# Patient Record
Sex: Female | Born: 1980 | Race: White | Hispanic: Yes | Marital: Married | Smoking: Never smoker
Health system: Southern US, Community
[De-identification: ages and names within clinical notes are randomized; demographics above are authoritative.]

## PROBLEM LIST (undated history)

## (undated) HISTORY — PX: BREAST BIOPSY: SHX20

---

## 2004-04-24 ENCOUNTER — Inpatient Hospital Stay: Payer: Self-pay | Admitting: Obstetrics and Gynecology

## 2006-09-12 ENCOUNTER — Ambulatory Visit: Payer: Self-pay

## 2006-09-12 IMAGING — US ULTRASOUND RIGHT BREAST
1 series · 16 of 16 positions shown · non-contrast
Comparison: RIGHT mammogram on [DATE].

REASON FOR EXAM: chest wall mass 1-3 oclock
COMMENTS:

PROCEDURE:     US  - US BREAST RIGHT  - [DATE]  [DATE]
RESULT:

[Series 1: ultrasound right breast · 16 of 16 slices shown]
[im 1/16]
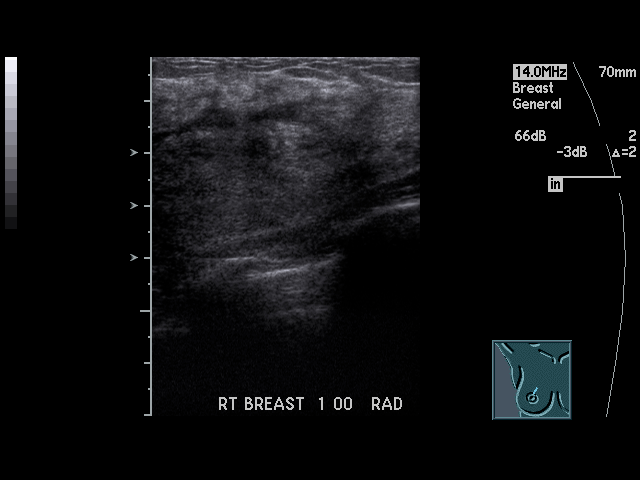
[im 2/16]
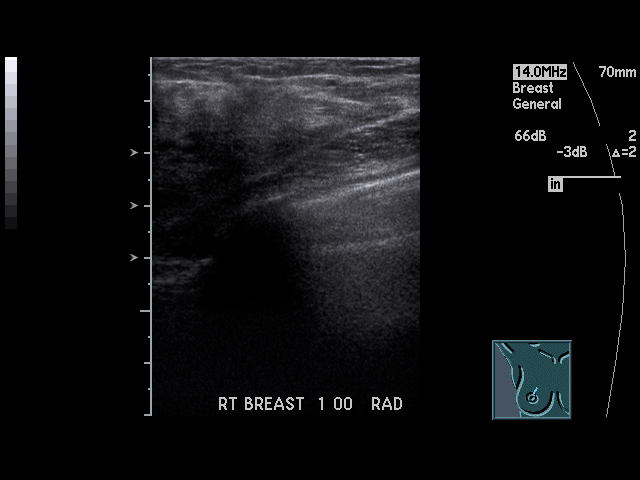
[im 3/16]
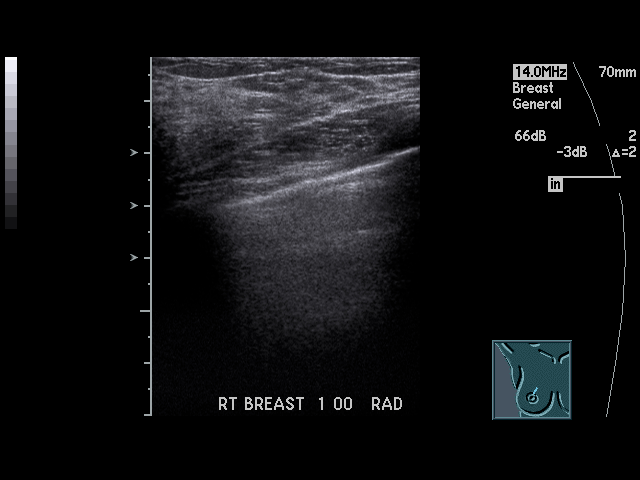
[im 4/16]
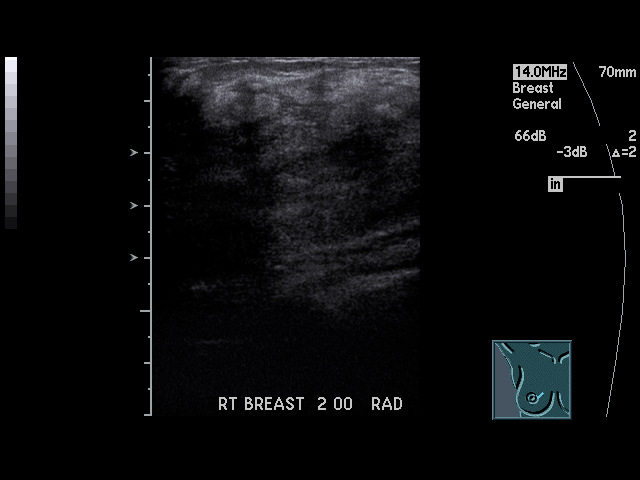
[im 5/16]
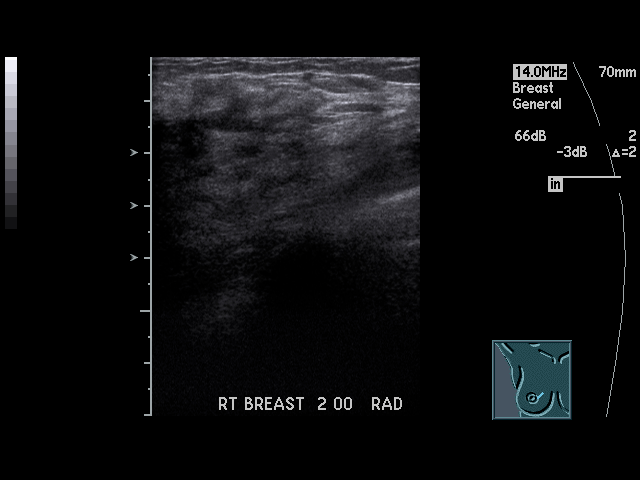
[im 6/16]
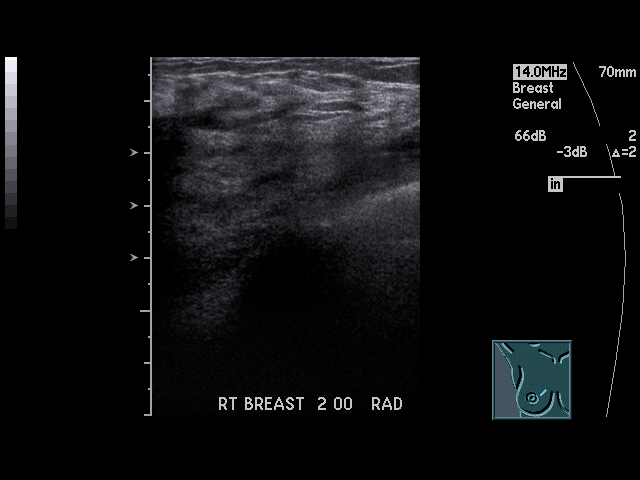
[im 7/16]
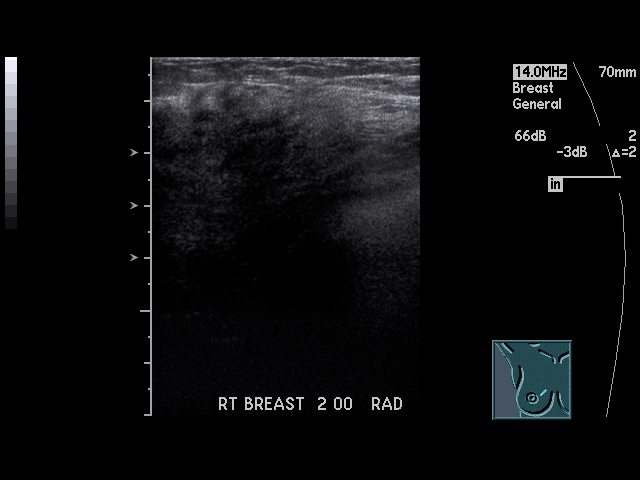
[im 8/16]
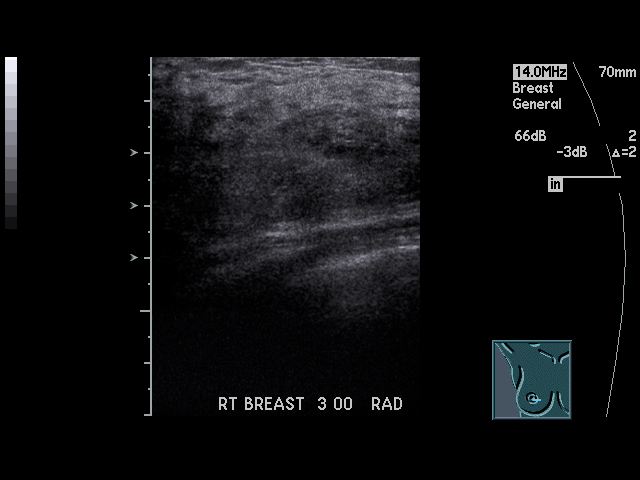
[im 9/16]
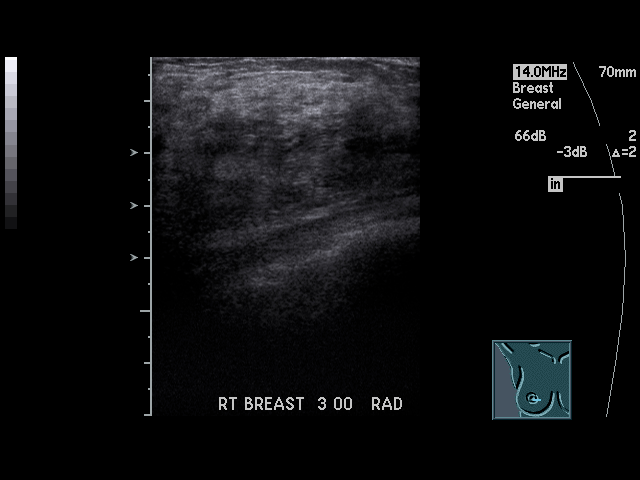
[im 10/16]
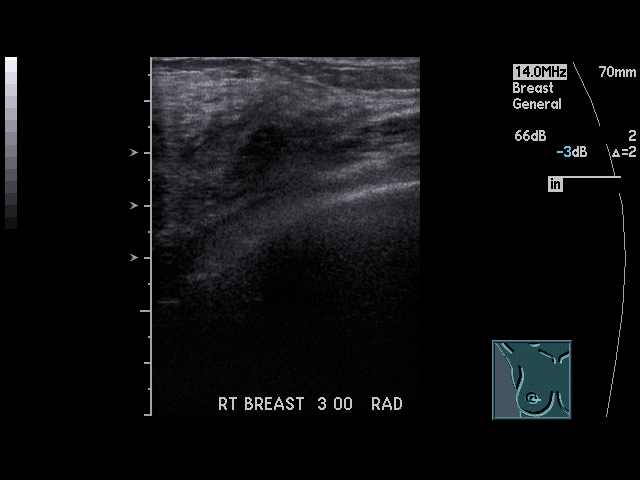
[im 11/16]
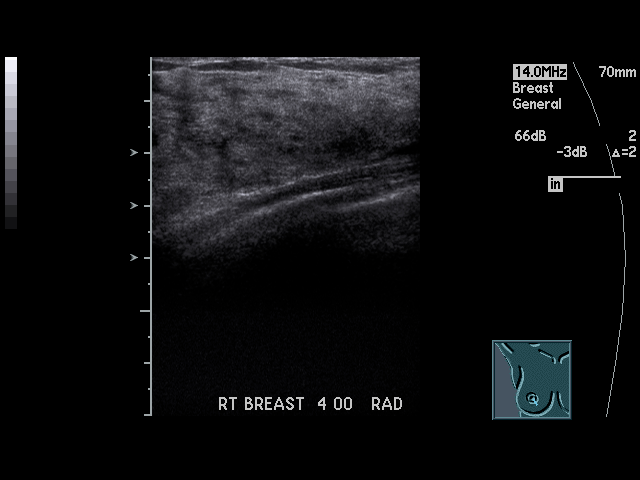
[im 12/16]
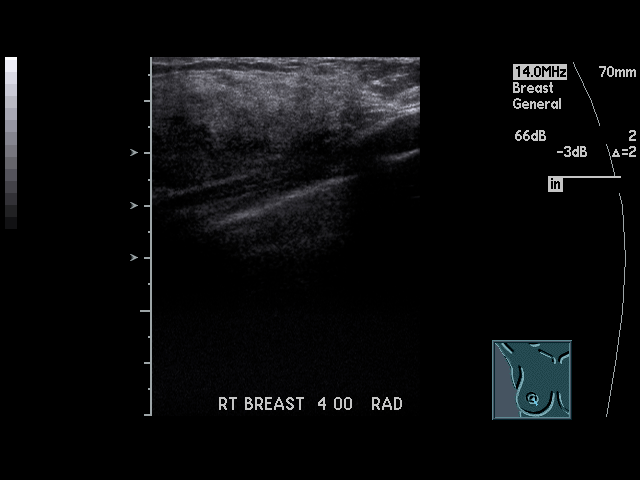
[im 13/16]
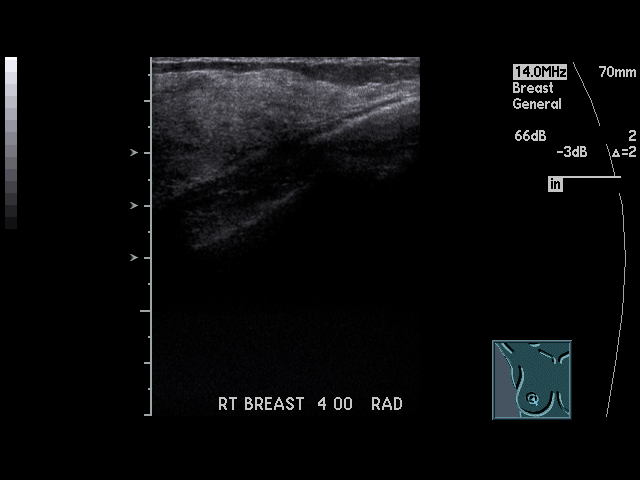
[im 14/16]
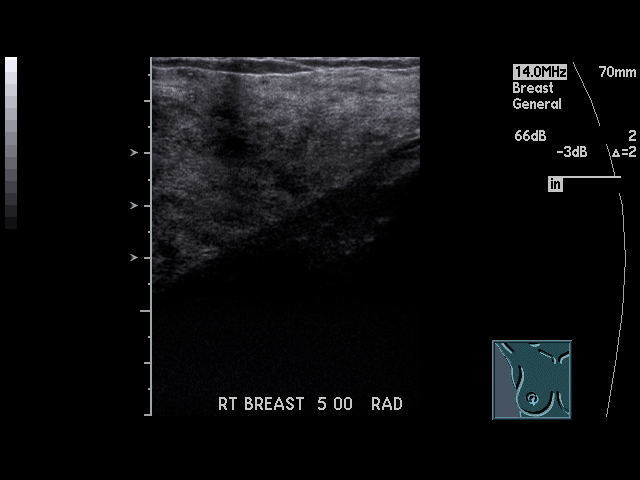
[im 15/16]
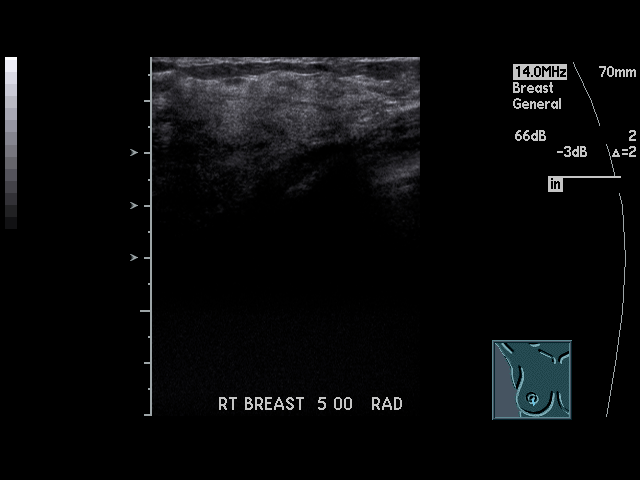
[im 16/16]
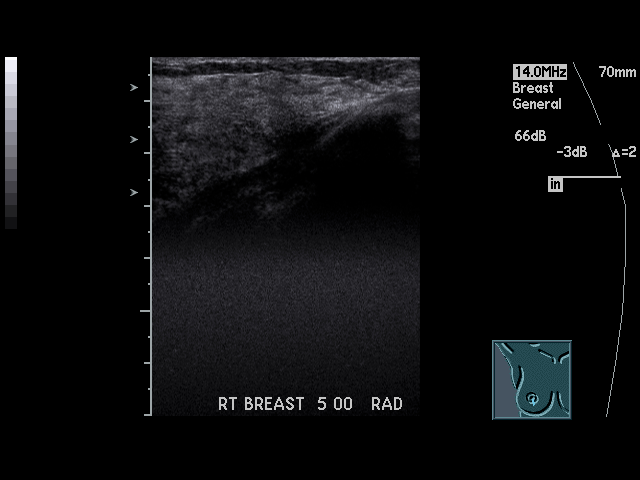

[16 of 16 positions shown; findings below may reference images not displayed]

FINDINGS: Focused ultrasound of the medial RIGHT breast was performed.  The
patient has an approximately 6 cm area of palpable lump involving the medial
RIGHT breast from 1 to 4 o'clock.  However, on ultrasound, this area does
not appear significantly different compared to other parts of her RIGHT
breast. No discrete lesion is noted on ultrasound of this area.
IMPRESSION: 1)The patient's palpable abnormality involving the medial RIGHT breast does
not exhibit a corresponding discrete mass on ultrasound.

2)Recommend clinical follow-up of this palpable area of concern with
referral to a breast surgeon.

3)BI-RADS: Category 1-Negative

## 2006-09-12 IMAGING — MG MAM DGTL UNI MAM RT BREAST W/CAD
1 series · 5 of 5 positions shown · non-contrast
Comparison: No prior mammogram. Focused RIGHT breast ultrasound on [DATE].

REASON FOR EXAM: chest wall mass 1-3 oclock
COMMENTS:

PROCEDURE:     MAM - MAM DGTL UNI MAM RT BREAST W/CAD  - [DATE]  [DATE]
RESULT:

[Series 7765: R CC · right · 5 of 5 slices shown]
[im 1/5]
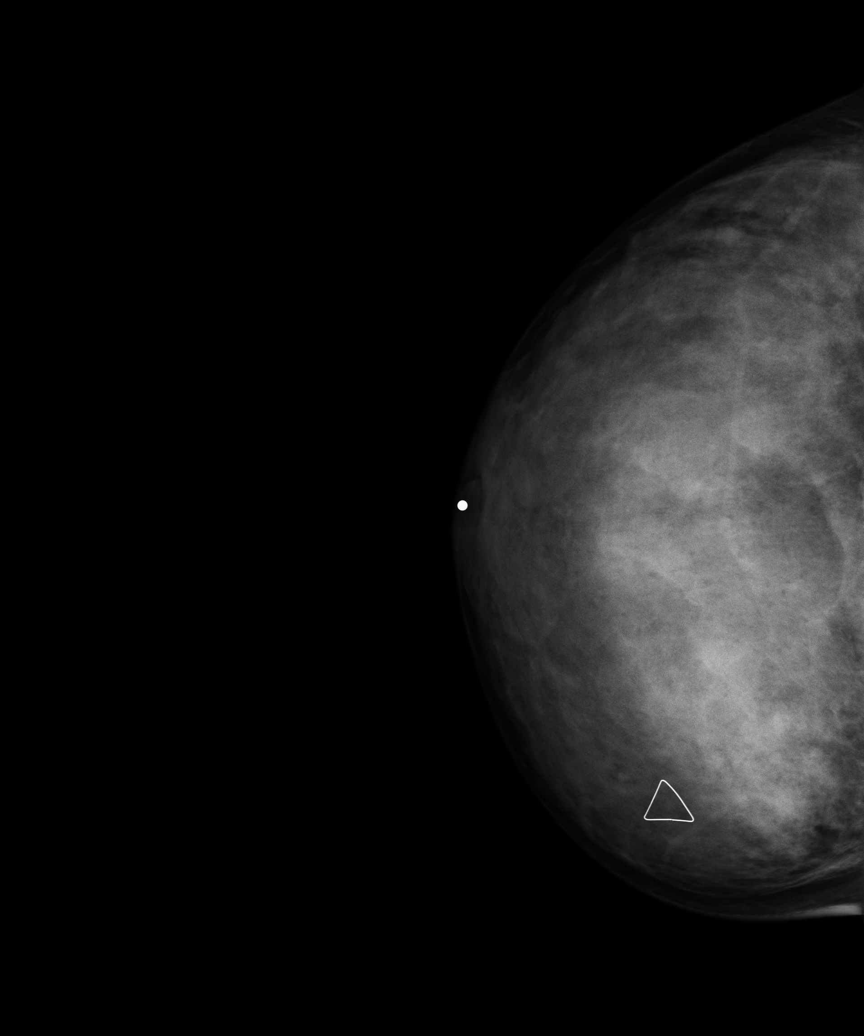
[im 2/5]
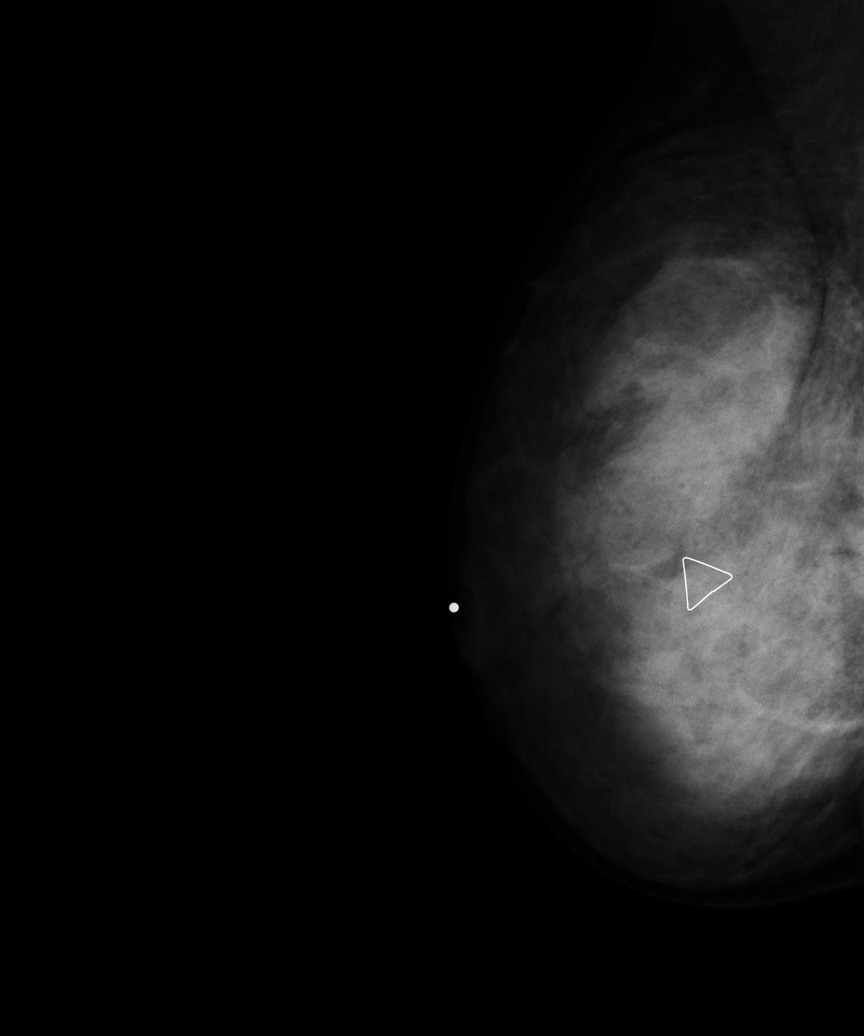
[im 3/5]
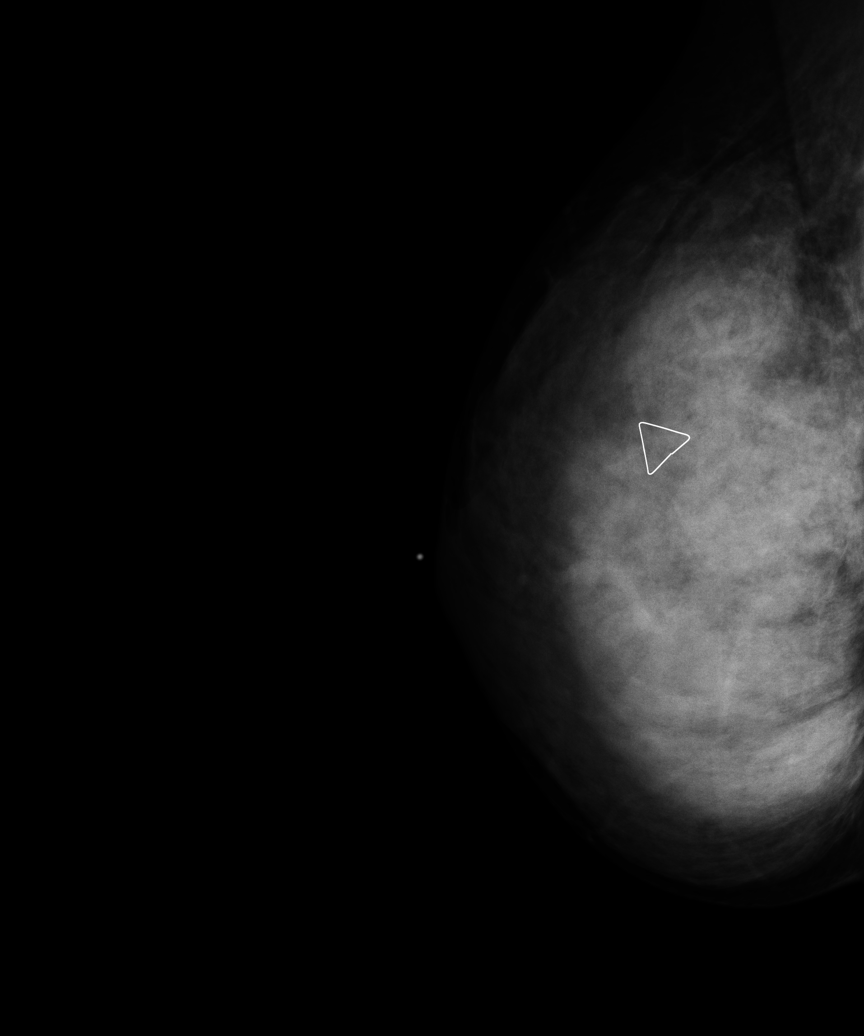
[im 4/5]
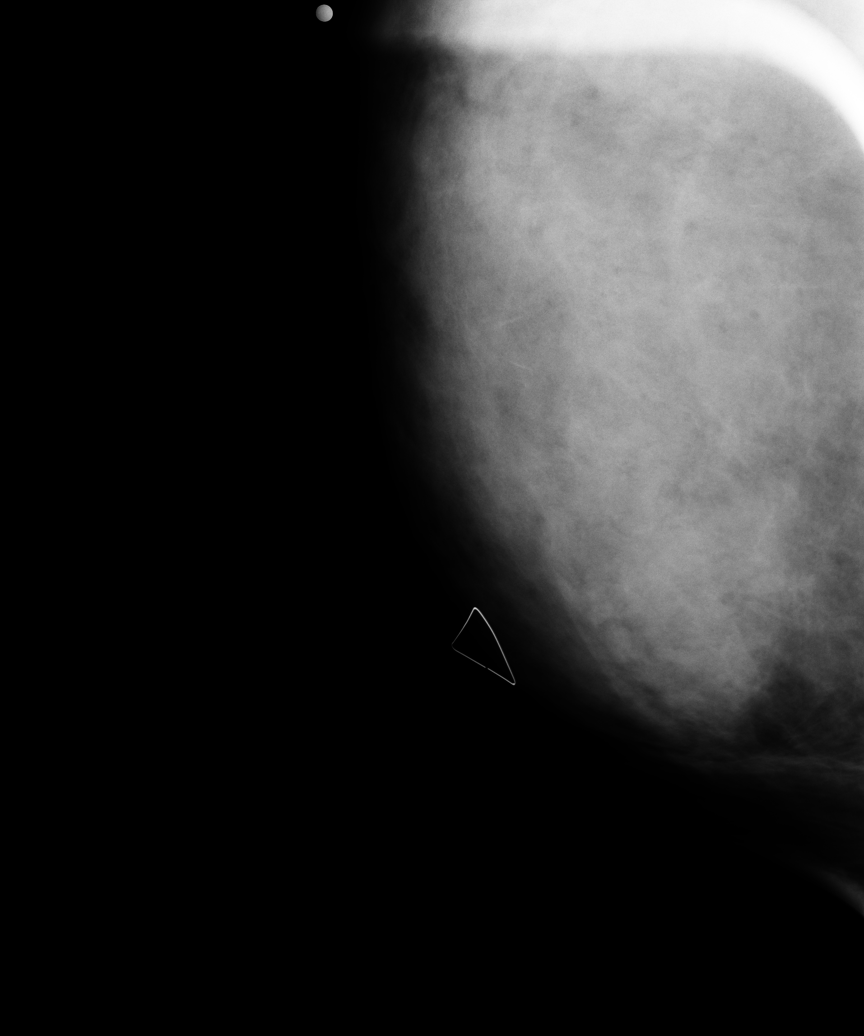
[im 5/5]
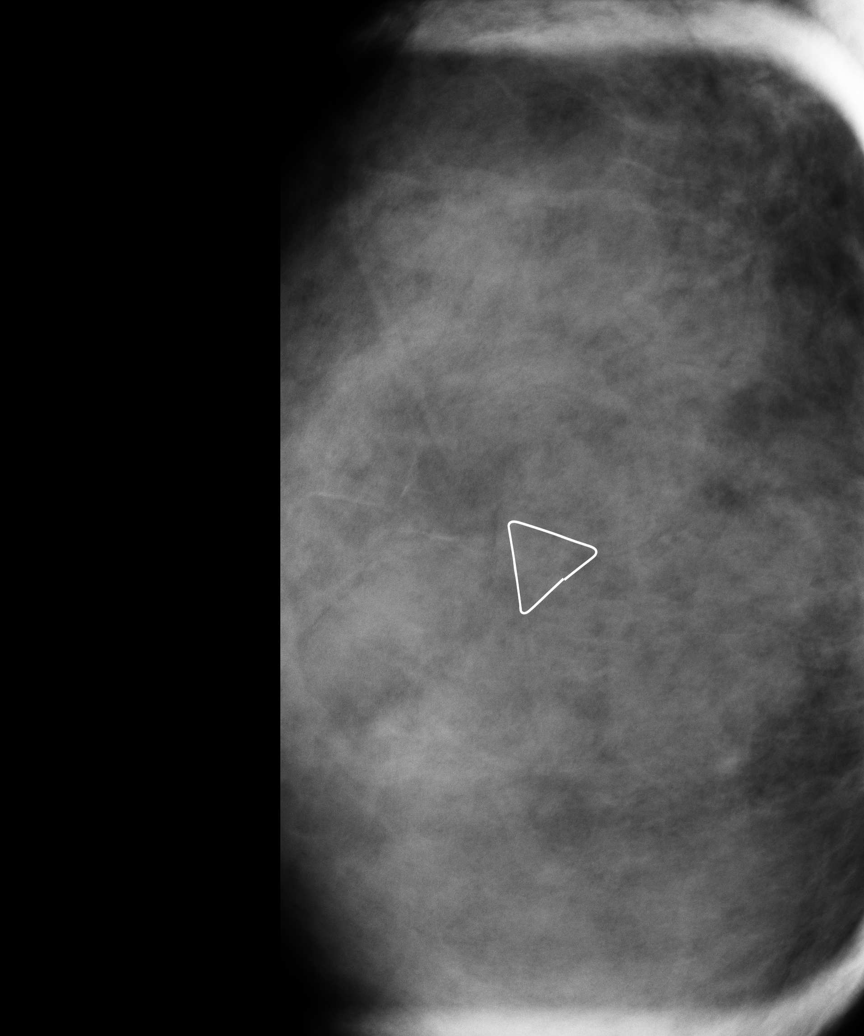

[5 of 5 positions shown; findings below may reference images not displayed]

FINDINGS: CC, MLO, ML views of the RIGHT breast was obtained in conjunction
with spot compression views over area of concern.

A marker was placed over the patient's area of concern.  The patient's RIGHT
breast is extremely dense, significantly lowering the sensitivity of
mammography.  No definite discrete mass or suspicious calcification is
noted.
IMPRESSION: 1)Negative RIGHT mammogram.

2)Recommend clinical follow-up of the patient's palpable area of concern
involving the RIGHT breast with referral to a breast surgeon.

3)BI-RADS: Category 1-Negative

A NEGATIVE MAMMOGRAM REPORT DOES NOT PRECLUDE BIOPSY OR OTHER EVALUATION OF
A CLINICALLY PALPABLE OR OTHERWISE SUSPICIOUS MASS OR LESION. BREAST CANCER
MAY NOT BE DETECTED BY MAMMOGRAPHY IN UP TO 10% OF CASES.

## 2006-10-04 ENCOUNTER — Ambulatory Visit: Payer: Self-pay | Admitting: General Surgery

## 2007-05-05 ENCOUNTER — Emergency Department: Payer: Self-pay | Admitting: Emergency Medicine

## 2007-05-06 ENCOUNTER — Ambulatory Visit: Payer: Self-pay | Admitting: Emergency Medicine

## 2007-05-06 IMAGING — US US OB < 14 WEEKS - US OB TV
1 series · 17 of 26 positions shown · non-contrast
Comparison: none

REASON FOR EXAM: abdominal pain   vomiting
COMMENTS:

[Series 1: us ob < 14 weeks - us ob tv · 17 of 26 slices shown]
[im 1/26]
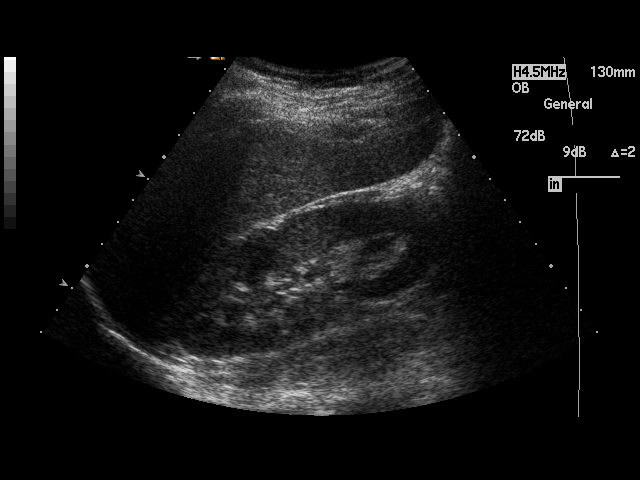
[im 3/26]
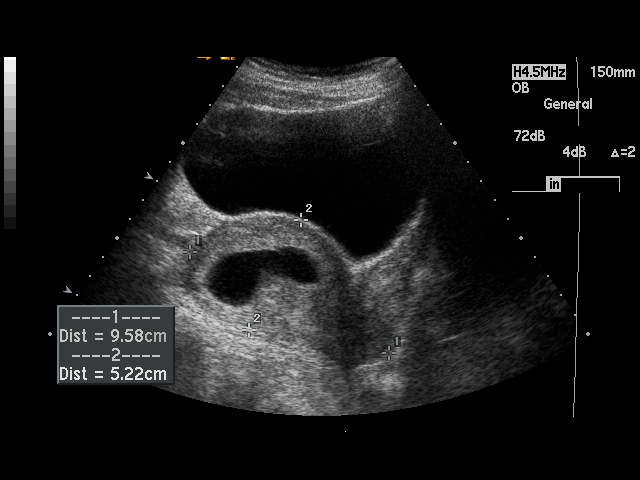
[im 4/26]
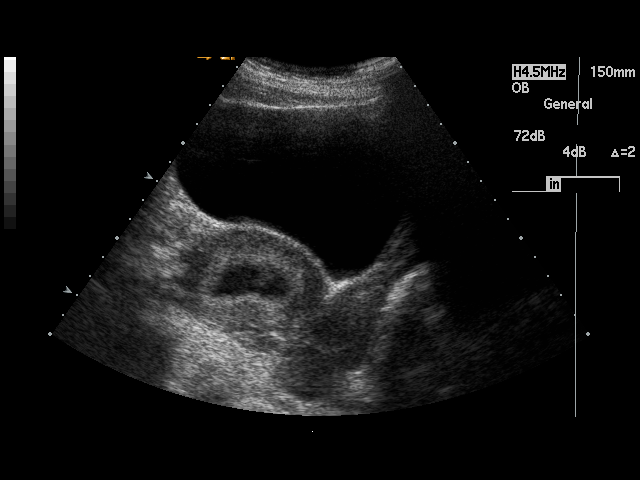
[im 6/26]
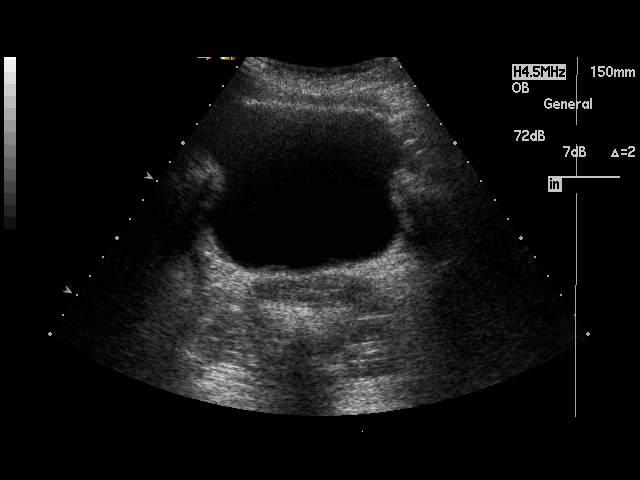
[im 7/26]
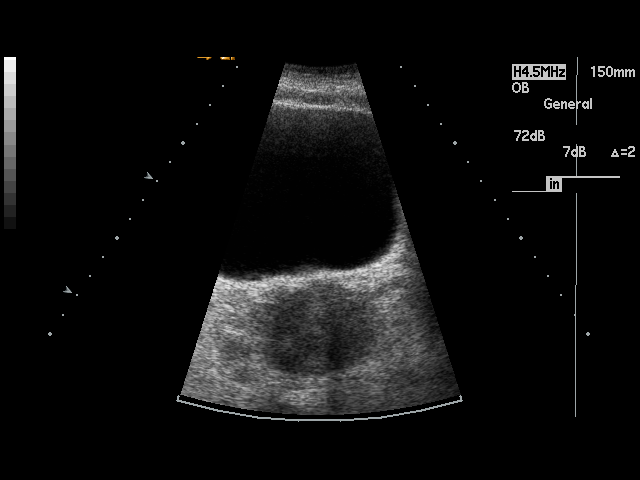
[im 9/26]
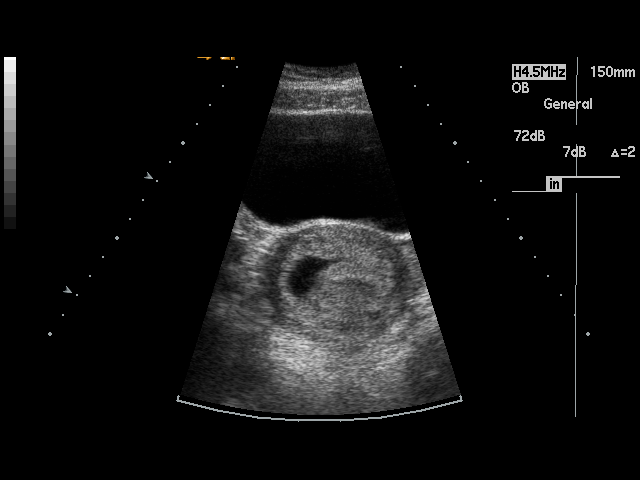
[im 10/26]
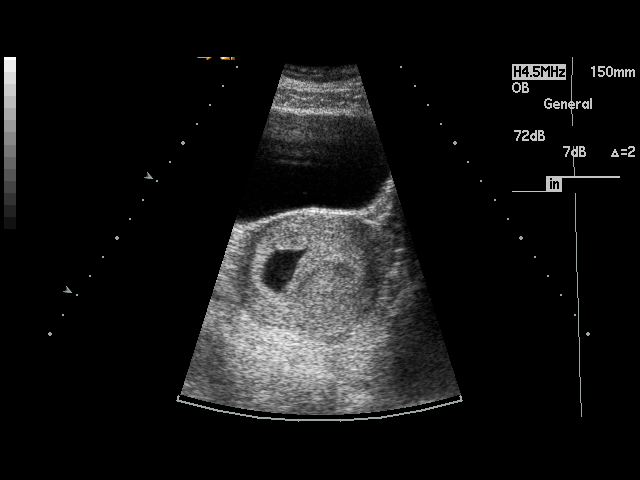
[im 12/26]
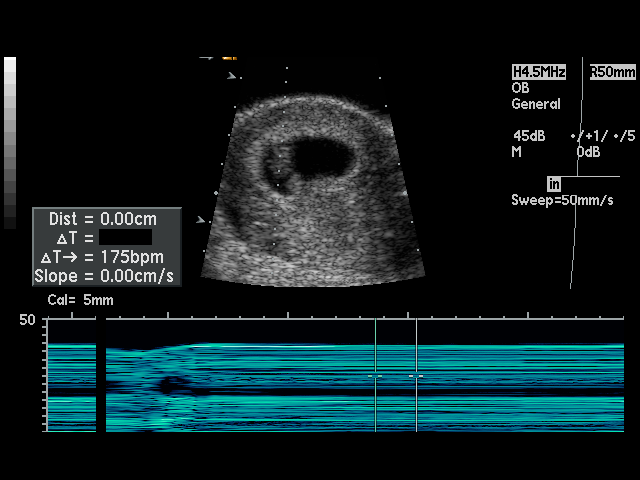
[im 14/26]
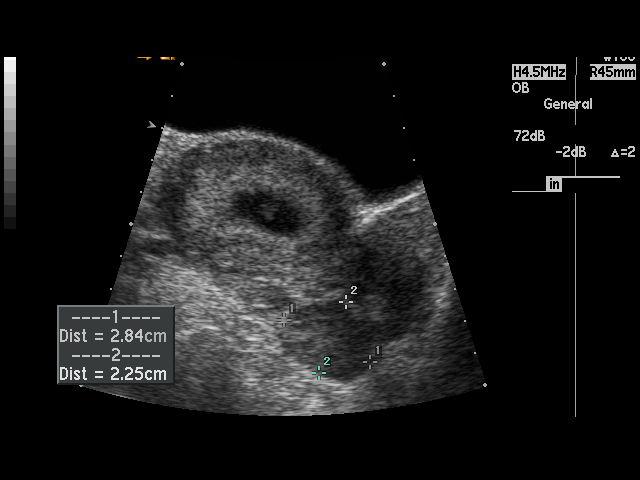
[im 15/26]
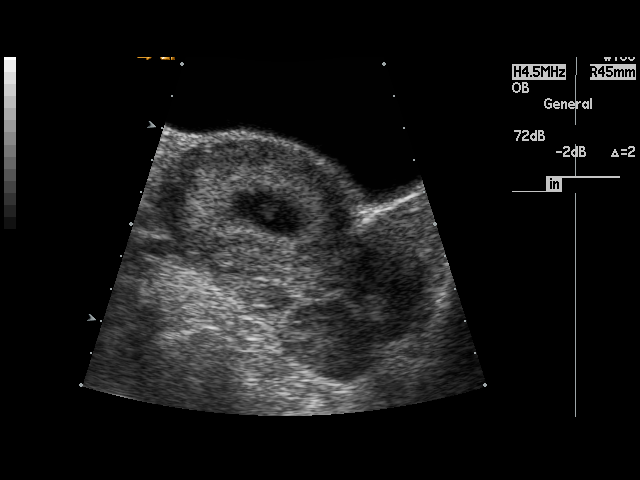
[im 17/26]
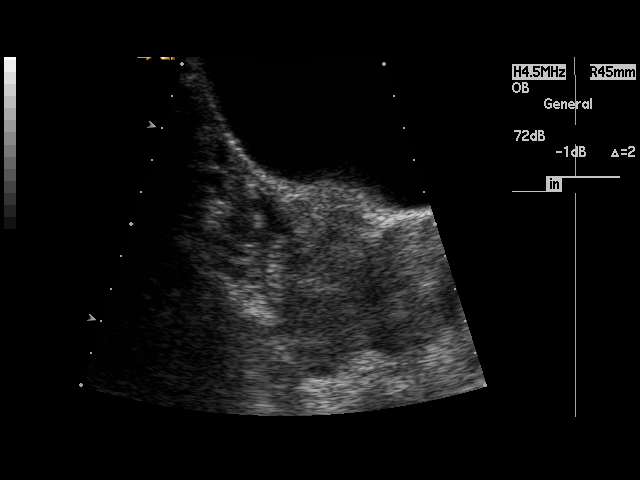
[im 18/26]
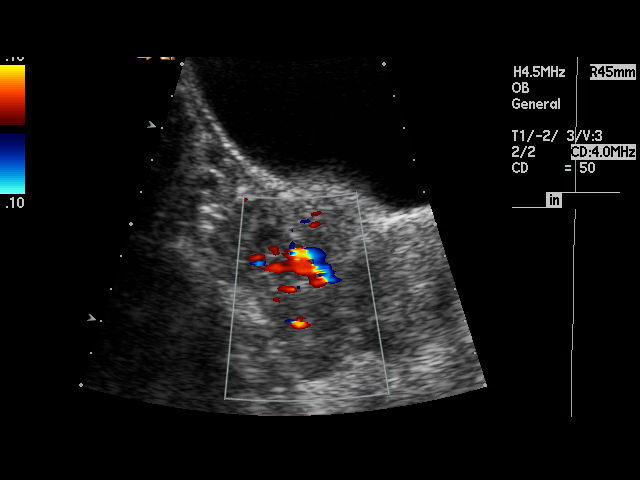
[im 20/26]
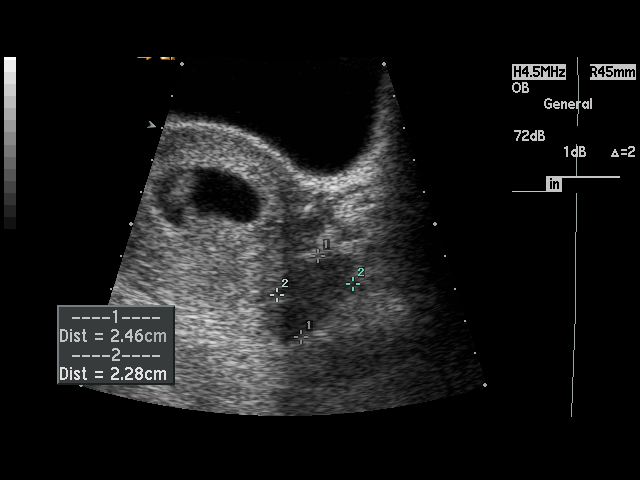
[im 21/26]
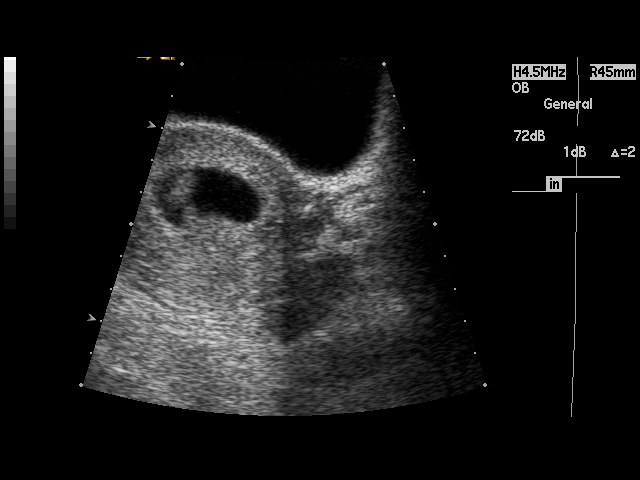
[im 23/26]
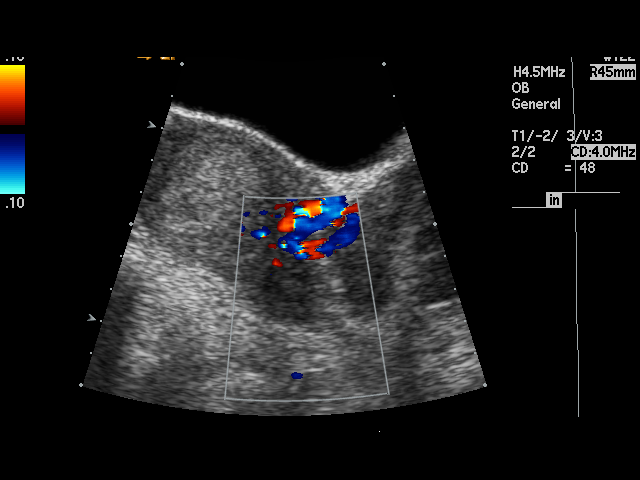
[im 24/26]
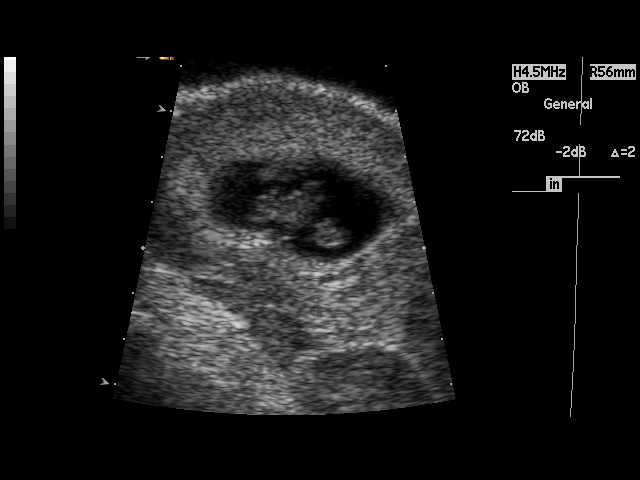
[im 26/26]
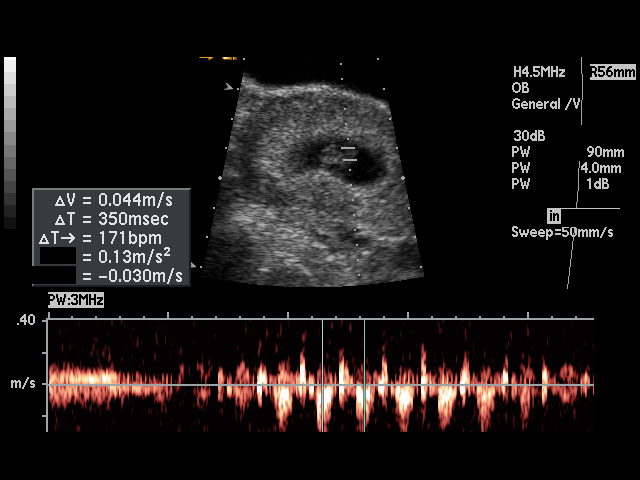

[17 of 26 positions shown; findings below may reference images not displayed]

PROCEDURE:     US  - US OB LESS THAN 14 WEEKS  - [DATE]  [DATE]

RESULT:     There is observed a living intrauterine gestation. Embryo heart
rate was monitored at 175 beats per minute. The yolk sac is visualized. No
subchorionic bleed is identified. The crown-rump length measures 1.78 cm
which corresponds to an estimated gestational age of 8 [MK] days.
Ultrasound EDD is [DATE].

The maternal ovaries are visualized bilaterally with no significant
abnormality seen. No abnormal adnexal masses are seen. There is no free
fluid noted in the maternal pelvis. The visualized portion of the urinary
bladder is normal in appearance.
IMPRESSION: 1.     Living intrauterine gestation of approximately 8 [MK] days
gestational age. Ultrasound EDD is [DATE].

## 2007-12-01 ENCOUNTER — Inpatient Hospital Stay: Payer: Self-pay | Admitting: Obstetrics and Gynecology

## 2013-06-09 ENCOUNTER — Inpatient Hospital Stay: Payer: Self-pay | Admitting: Obstetrics and Gynecology

## 2013-06-09 LAB — CBC WITH DIFFERENTIAL/PLATELET
Basophil #: 0 10*3/uL (ref 0.0–0.1)
Basophil %: 0.2 %
Eosinophil #: 0 10*3/uL (ref 0.0–0.7)
Eosinophil %: 0.2 %
HCT: 38.4 % (ref 35.0–47.0)
HGB: 12.9 g/dL (ref 12.0–16.0)
LYMPHS PCT: 20.7 %
Lymphocyte #: 2.4 10*3/uL (ref 1.0–3.6)
MCH: 30.6 pg (ref 26.0–34.0)
MCHC: 33.7 g/dL (ref 32.0–36.0)
MCV: 91 fL (ref 80–100)
Monocyte #: 1 x10 3/mm — ABNORMAL HIGH (ref 0.2–0.9)
Monocyte %: 8.8 %
Neutrophil #: 8.3 10*3/uL — ABNORMAL HIGH (ref 1.4–6.5)
Neutrophil %: 70.1 %
Platelet: 211 10*3/uL (ref 150–440)
RBC: 4.22 10*6/uL (ref 3.80–5.20)
RDW: 13.9 % (ref 11.5–14.5)
WBC: 11.8 10*3/uL — ABNORMAL HIGH (ref 3.6–11.0)

## 2013-06-10 LAB — HEMATOCRIT: HCT: 36.4 % (ref 35.0–47.0)

## 2014-06-02 NOTE — H&P (Signed)
L&D Evaluation:  History:  [facility] 34 [facility] with regular contractions, denies leaking fluid vaginal bleeding, baby is active. GBS negative[facility]   Presents with contractions   Patient's Medical History No Chronic Illness   Patient's Surgical History none   Medications Pre Natal Vitamins   Allergies NKDA   Social History none   Family History Non-Contributory   ROS:  ROS All systems were reviewed.  HEENT, CNS, GI, GU, Respiratory, CV, Renal and Musculoskeletal systems were found to be normal.   Exam:  Vital Signs stable   Urine Protein not completed   General no apparent distress   Mental Status clear   Chest clear   Heart normal sinus rhythm   Abdomen gravid, non-tender   Estimated Fetal Weight Average for gestational age   Fetal Position vtx   Fundal Height term   Back no CVAT   Edema 1+  pedal   Reflexes 2+   Pelvic no external lesions   Mebranes AROM @ 1712   Description clear   FHT baseline 130's 140's avg variability with accels   Ucx EFM reapplied   Skin dry   Lymph no lymphadenopathy   Impression:  Impression early labor   Plan:  Plan monitor contractions and for cervical change   Comments Back to room after walking, uc's picking up. Family present, plans natural childbirth. Knows what to expect 3rd baby.   Electronic Signatures: Albertina ParrLugiano, Gaetano Romberger B (CNM)  (Signed 18-May-15 17:23)  Authored: L&D Evaluation   Last Updated: 18-May-15 17:23 by Albertina ParrLugiano, Sidharth Leverette B (CNM)

## 2015-09-20 ENCOUNTER — Other Ambulatory Visit: Payer: Self-pay | Admitting: Obstetrics and Gynecology

## 2015-09-20 DIAGNOSIS — Z1231 Encounter for screening mammogram for malignant neoplasm of breast: Secondary | ICD-10-CM

## 2015-10-06 ENCOUNTER — Encounter (HOSPITAL_COMMUNITY): Payer: Self-pay

## 2015-10-06 ENCOUNTER — Ambulatory Visit
Admission: RE | Admit: 2015-10-06 | Discharge: 2015-10-06 | Disposition: A | Discharge status: Home / Self Care | Payer: Managed Care, Other (non HMO) | Source: Ambulatory Visit | Attending: Obstetrics and Gynecology | Admitting: Obstetrics and Gynecology

## 2015-10-06 DIAGNOSIS — Z1231 Encounter for screening mammogram for malignant neoplasm of breast: Secondary | ICD-10-CM | POA: Diagnosis not present

## 2015-10-06 IMAGING — MG MM DIGITAL SCREENING BILAT W/ CAD
5 series · 5 of 5 positions shown · non-contrast
Comparison: Previous exam(s).

CLINICAL DATA: Screening.

EXAM:
DIGITAL SCREENING BILATERAL MAMMOGRAM WITH CAD

[L MLO]
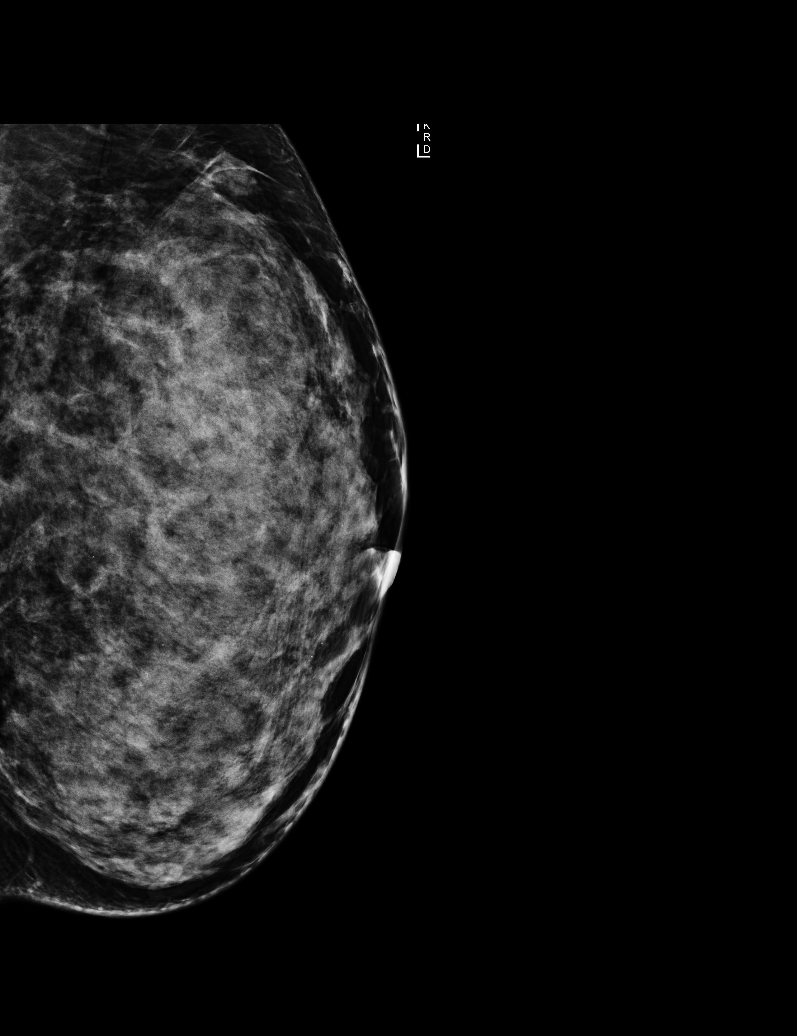

[R MLO (1 of 2)]
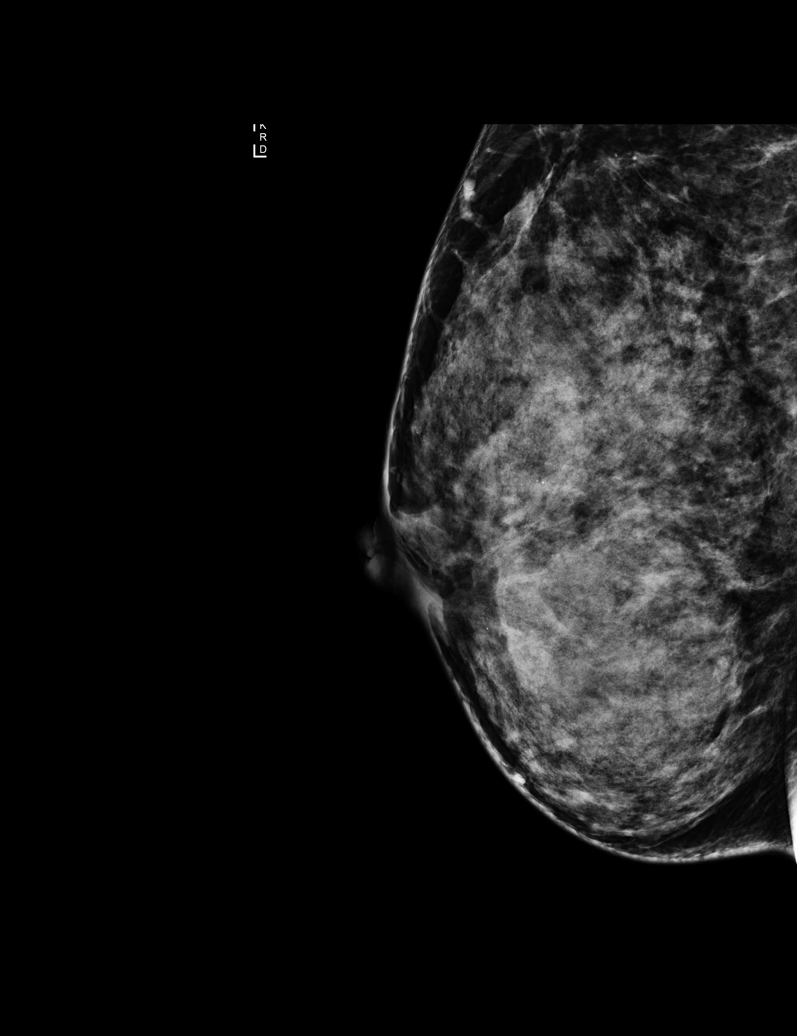

[R MLO (2 of 2)]
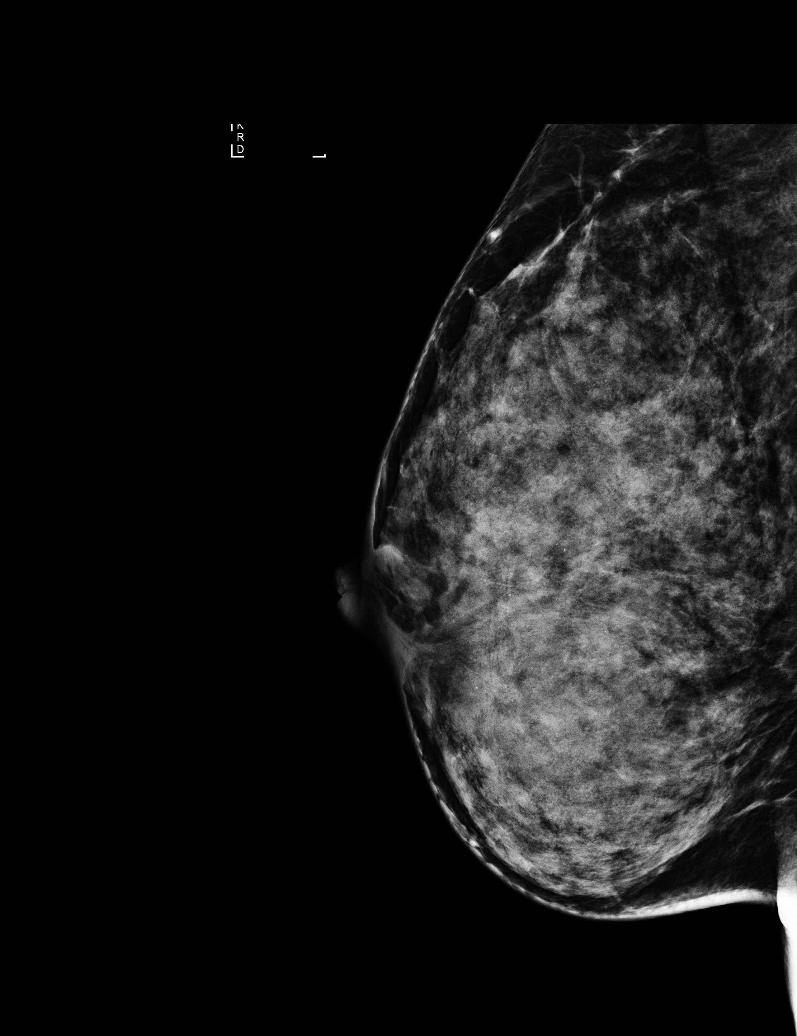

[R CC]
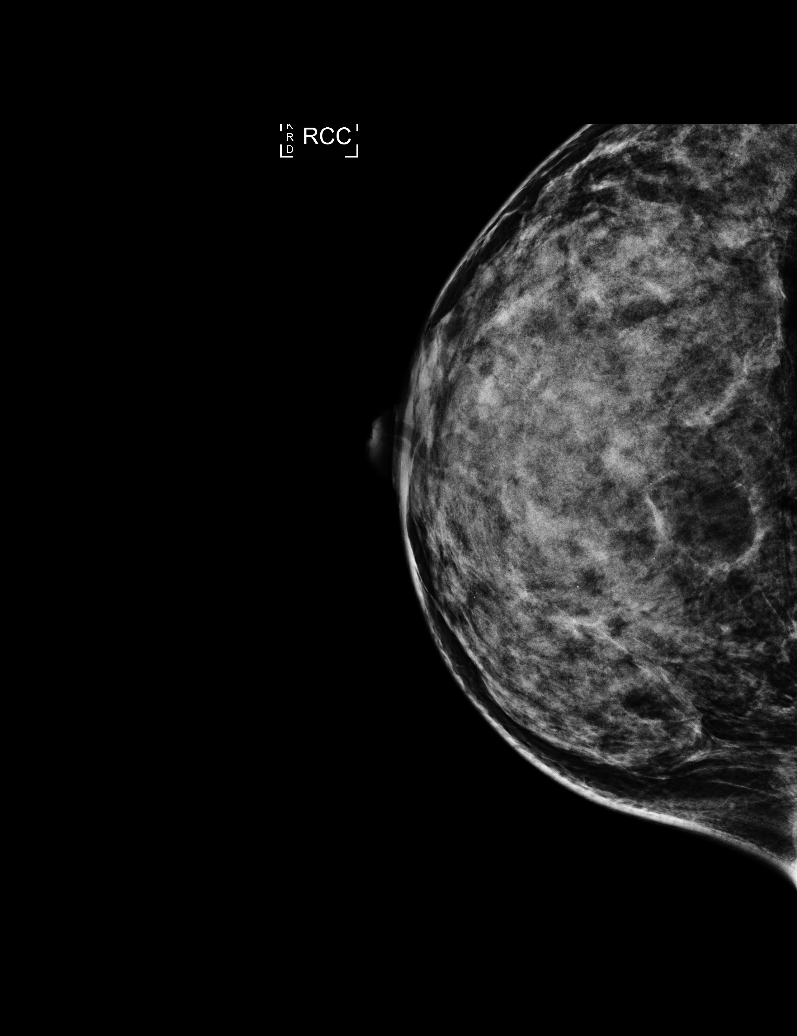

[L CC]
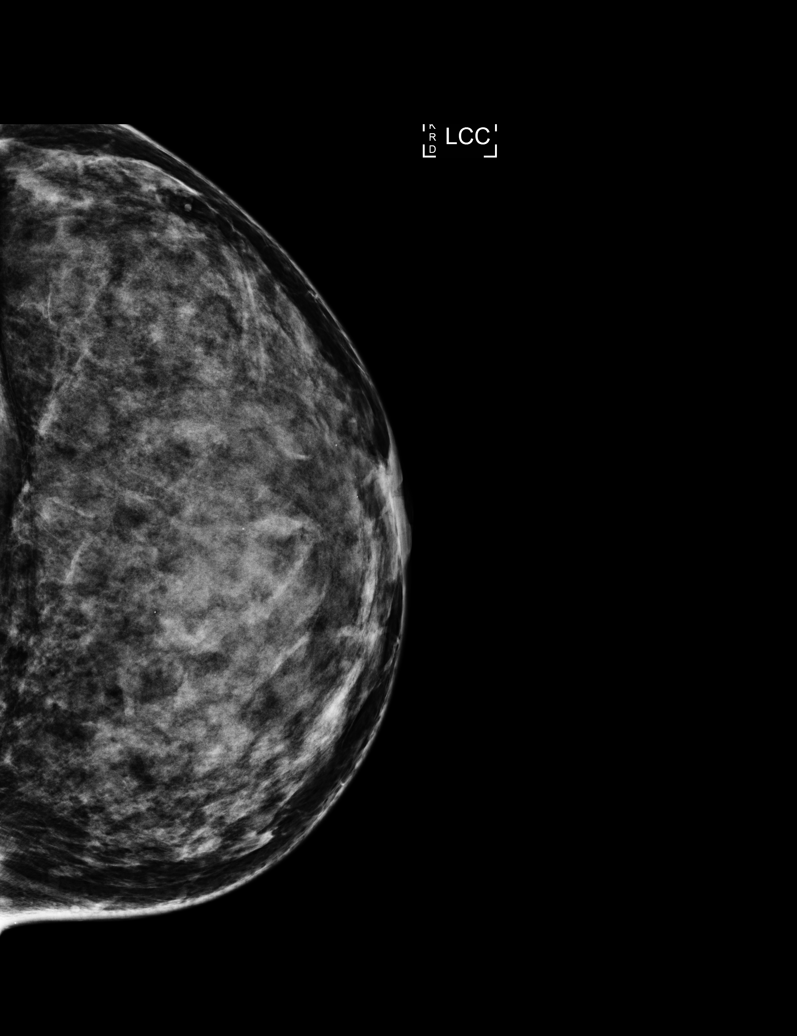

[5 of 5 positions shown; findings below may reference images not displayed]

ACR Breast Density Category d: The breast tissue is extremely dense,
which lowers the sensitivity of mammography.
FINDINGS: There are no findings suspicious for malignancy. Images were
processed with CAD.
IMPRESSION: No mammographic evidence of malignancy. A result letter of this
screening mammogram will be mailed directly to the patient.

RECOMMENDATION:
Screening mammogram at age 40. (Code:[KA])

BI-RADS CATEGORY  1: Negative.

## 2017-01-23 NOTE — L&D Delivery Note (Signed)
Delivery Note  Date of delivery: 08/28/2017 Estimated Date of Delivery: 08/30/17 Patient's last menstrual period was 11/23/2016. EGA: 6033w5d  First Stage: Labor onset: 1600 08/28/17 Augmentation : none Analgesia Eliezer Lofts/Anesthesia intrapartum: none SROM at 1858  Chelsea Hoffman presented to L&D with contractions. She expectantly managed.    Second Stage: Complete dilation at 1911 08/28/17 Onset of pushing at 1911 08/28/17 FHT second stage: 150s by monitor Delivery at 1913 on 08/28/2017  She progressed to complete and had a spontaneous vaginal birth of a live female over an intact perineum. The fetal head was delivered in direct OA position with restitution to ROA. No nuchal cord, right nuchal hand. Anterior then posterior shoulders delivered spontaneously. Baby placed on mom's abdomen and attended to by transition RN. Cord clamped and cut when pulseless by father of the baby, Chelsea Hoffman.   Third Stage: Placenta delivered spontaneously intact with 3VC at 1920 Placenta disposition: routine disposal Uterine tone firm / bleeding none IM pitocin given for hemorrhage prophylaxis, as IV had not been placed  No laceration identified  Anesthesia for repair: n/a Repair: n/a Est. Blood Loss (mL): 50mL  Complications: precipitous delivery, true knot on umbilical cord  Mom to postpartum.  Baby to Couplet care / Skin to Skin.  Newborn: Birth Weight: pending  Apgar Scores: 8, 9 Feeding planned: breastfeeding   Chelsea Hoffman, CNM 08/28/2017 8:22 PM

## 2017-02-06 LAB — OB RESULTS CONSOLE ANTIBODY SCREEN: Antibody Screen: NEGATIVE

## 2017-02-06 LAB — OB RESULTS CONSOLE ABO/RH: RH Type: POSITIVE

## 2017-02-06 LAB — OB RESULTS CONSOLE HEPATITIS B SURFACE ANTIGEN: Hepatitis B Surface Ag: NEGATIVE

## 2017-08-01 LAB — OB RESULTS CONSOLE GBS: GBS: NEGATIVE

## 2017-08-01 LAB — OB RESULTS CONSOLE RUBELLA ANTIBODY, IGM: Rubella: IMMUNE

## 2017-08-01 LAB — OB RESULTS CONSOLE VARICELLA ZOSTER ANTIBODY, IGG: VARICELLA IGG: IMMUNE

## 2017-08-01 LAB — OB RESULTS CONSOLE RPR: RPR: NONREACTIVE

## 2017-08-01 LAB — OB RESULTS CONSOLE GC/CHLAMYDIA
CHLAMYDIA, DNA PROBE: NEGATIVE
Gonorrhea: NEGATIVE

## 2017-08-01 LAB — OB RESULTS CONSOLE HIV ANTIBODY (ROUTINE TESTING): HIV: NONREACTIVE

## 2017-08-28 ENCOUNTER — Inpatient Hospital Stay
Admission: EM | Admit: 2017-08-28 | Discharge: 2017-08-30 | DRG: 798 | Disposition: A | Discharge status: Home / Self Care | Payer: No Typology Code available for payment source | Attending: Certified Nurse Midwife | Admitting: Certified Nurse Midwife

## 2017-08-28 ENCOUNTER — Other Ambulatory Visit: Payer: Self-pay

## 2017-08-28 ENCOUNTER — Encounter: Payer: Self-pay | Admitting: Certified Nurse Midwife

## 2017-08-28 DIAGNOSIS — Z3A39 39 weeks gestation of pregnancy: Secondary | ICD-10-CM | POA: Diagnosis not present

## 2017-08-28 DIAGNOSIS — Z3483 Encounter for supervision of other normal pregnancy, third trimester: Secondary | ICD-10-CM | POA: Diagnosis present

## 2017-08-28 DIAGNOSIS — Z302 Encounter for sterilization: Secondary | ICD-10-CM

## 2017-08-28 DIAGNOSIS — O479 False labor, unspecified: Secondary | ICD-10-CM | POA: Diagnosis present

## 2017-08-28 LAB — CBC
HEMATOCRIT: 39.1 % (ref 35.0–47.0)
HEMOGLOBIN: 13.4 g/dL (ref 12.0–16.0)
MCH: 30.9 pg (ref 26.0–34.0)
MCHC: 34.2 g/dL (ref 32.0–36.0)
MCV: 90.6 fL (ref 80.0–100.0)
Platelets: 255 10*3/uL (ref 150–440)
RBC: 4.32 MIL/uL (ref 3.80–5.20)
RDW: 13.8 % (ref 11.5–14.5)
WBC: 16.4 10*3/uL — AB (ref 3.6–11.0)

## 2017-08-28 LAB — TYPE AND SCREEN
ABO/RH(D): A POS
ANTIBODY SCREEN: NEGATIVE

## 2017-08-28 MED ORDER — DIBUCAINE 1 % RE OINT: 1.0000 "application " | TOPICAL_OINTMENT | RECTAL | Status: DC | PRN

## 2017-08-28 MED ORDER — LACTATED RINGERS IV SOLN: 500.0000 mL | INTRAVENOUS | Status: DC | PRN

## 2017-08-28 MED ORDER — DIPHENHYDRAMINE HCL 25 MG PO CAPS: 25.0000 mg | ORAL_CAPSULE | Freq: Four times a day (QID) | ORAL | Status: DC | PRN

## 2017-08-28 MED ORDER — SIMETHICONE 80 MG PO CHEW
160.0000 mg | CHEWABLE_TABLET | Freq: Four times a day (QID) | ORAL | Status: DC | PRN
  Administered 2017-08-30: 160 mg via ORAL
  Filled 2017-08-28: qty 2

## 2017-08-28 MED ORDER — OXYTOCIN BOLUS FROM INFUSION: 500.0000 mL | Freq: Once | INTRAVENOUS | Status: DC

## 2017-08-28 MED ORDER — ACETAMINOPHEN 325 MG PO TABS: 650.0000 mg | ORAL_TABLET | ORAL | Status: DC | PRN

## 2017-08-28 MED ORDER — OXYTOCIN 10 UNIT/ML IJ SOLN
10.0000 [IU] | Freq: Once | INTRAMUSCULAR | Status: AC
  Administered 2017-08-28: 10 [IU] via INTRAMUSCULAR

## 2017-08-28 MED ORDER — COCONUT OIL OIL
1.0000 "application " | TOPICAL_OIL | Status: DC | PRN
  Filled 2017-08-28: qty 120

## 2017-08-28 MED ORDER — ONDANSETRON HCL 4 MG PO TABS: 4.0000 mg | ORAL_TABLET | ORAL | Status: DC | PRN

## 2017-08-28 MED ORDER — AMMONIA AROMATIC IN INHA
RESPIRATORY_TRACT | Status: AC
  Filled 2017-08-28: qty 10

## 2017-08-28 MED ORDER — SOD CITRATE-CITRIC ACID 500-334 MG/5ML PO SOLN: 30.0000 mL | ORAL | Status: DC | PRN

## 2017-08-28 MED ORDER — WITCH HAZEL-GLYCERIN EX PADS: 1.0000 "application " | MEDICATED_PAD | CUTANEOUS | Status: DC | PRN

## 2017-08-28 MED ORDER — PRENATAL MULTIVITAMIN CH
1.0000 | ORAL_TABLET | Freq: Every day | ORAL | Status: DC
  Administered 2017-08-29 – 2017-08-30 (×2): 1 via ORAL
  Filled 2017-08-28 (×2): qty 1

## 2017-08-28 MED ORDER — ONDANSETRON HCL 4 MG/2ML IJ SOLN: 4.0000 mg | Freq: Four times a day (QID) | INTRAMUSCULAR | Status: DC | PRN

## 2017-08-28 MED ORDER — IBUPROFEN 600 MG PO TABS: 600.0000 mg | ORAL_TABLET | Freq: Four times a day (QID) | ORAL | Status: DC

## 2017-08-28 MED ORDER — SENNOSIDES-DOCUSATE SODIUM 8.6-50 MG PO TABS
2.0000 | ORAL_TABLET | ORAL | Status: DC
  Administered 2017-08-29 – 2017-08-30 (×2): 2 via ORAL
  Filled 2017-08-28 (×3): qty 2

## 2017-08-28 MED ORDER — LIDOCAINE HCL (PF) 1 % IJ SOLN: 30.0000 mL | INTRAMUSCULAR | Status: DC | PRN

## 2017-08-28 MED ORDER — LIDOCAINE HCL (PF) 1 % IJ SOLN
INTRAMUSCULAR | Status: AC
  Filled 2017-08-28: qty 30

## 2017-08-28 MED ORDER — OXYTOCIN 40 UNITS IN LACTATED RINGERS INFUSION - SIMPLE MED: 2.5000 [IU]/h | INTRAVENOUS | Status: DC

## 2017-08-28 MED ORDER — ONDANSETRON HCL 4 MG/2ML IJ SOLN: 4.0000 mg | INTRAMUSCULAR | Status: DC | PRN

## 2017-08-28 MED ORDER — ZOLPIDEM TARTRATE 5 MG PO TABS: 5.0000 mg | ORAL_TABLET | Freq: Every evening | ORAL | Status: DC | PRN

## 2017-08-28 MED ORDER — BENZOCAINE-MENTHOL 20-0.5 % EX AERO
1.0000 | INHALATION_SPRAY | CUTANEOUS | Status: DC | PRN
Start: 2017-08-28 — End: 2017-08-30

## 2017-08-28 MED ORDER — MISOPROSTOL 200 MCG PO TABS
ORAL_TABLET | ORAL | Status: AC
  Filled 2017-08-28: qty 4

## 2017-08-28 MED ORDER — OXYTOCIN 10 UNIT/ML IJ SOLN
INTRAMUSCULAR | Status: AC
  Filled 2017-08-28: qty 2

## 2017-08-28 NOTE — H&P (Signed)
*Delayed entry due to precipitous delivery*  OB History & Physical   History of Present Illness:  Chief Complaint:   HPI:  Chelsea Hoffman is a 37 y.o. [phone] female at 4033w5d dated by LMP c/w 1631w5d ultrasound. She presents to L&D with contractions.   She reports:  -active fetal movement -no leakage of fluid  -no vaginal bleeding -onset of contractions at 1600 currently every 4-5 minutes  Pregnancy Issues: 1. AMA 2. Elevated 1h OGTT, 3h OGTT WNL   Maternal Medical History:  No past medical history on file.  Past Surgical History:  Procedure Laterality Date  . BREAST BIOPSY Right    neg    No Known Allergies  Prior to Admission medications   Medication Sig Start Date End Date Taking? Authorizing Provider  Prenatal Vit-Fe Fumarate-FA (MULTIVITAMIN-PRENATAL) 27-0.8 MG TABS tablet Take 1 tablet by mouth daily at 12 noon.   Yes [provider]     [facility]   Social History: She  reports that she has never smoked. She has never used smokeless tobacco.  Family History: family history includes Breast cancer (age of onset: 6845) in her maternal grandmother; Breast cancer (age of onset: 1152) in her maternal aunt.   Review of Systems: A full review of systems was performed and negative except as noted in the HPI.    Physical Exam:  Vital Signs: BP 111/65   Pulse 89   LMP 11/23/2016   Breastfeeding? Unknown   General:   alert, cooperative, appears stated age and moderate distress  Skin:  normal and no rash or abnormalities  Neurologic:    Alert & oriented x 3  Lungs:   clear to auscultation bilaterally  Heart:   regular rate and rhythm, S1, S2 normal, no murmur, click, rub or gallop  Abdomen:  soft, non-tender; bowel sounds normal; no masses,  no organomegaly  Pelvis:  External genitalia: normal general appearance  FHT:  135 BPM  Presentations: cephalic  Cervix:  per RN Marshia LyJoy Wagoner [phone]   Dilation: 8cm   Effacement: 100%   Station:  0  Extremities: : non-tender, symmetric, no edema bilaterally.       Pertinent Results:  Prenatal Labs: Blood type/Rh A+  Antibody screen neg  Rubella Immune  Varicella Immune  RPR NR  HBsAg Neg  HIV NR  GC neg  Chlamydia neg  Genetic screening declined  1 hour GTT 149  3 hour GTT 83, 141, 156, 136  GBS negative   FHT: FHR: 130 bpm, variability: moderate,  accelerations:  Present,  decelerations:  Absent Category/reactivity:  Category I TOCO: regular, every 2 minutes per patient  Assessment:  Chelsea CoppLesley A Hoffman is a 37 y.o. 265P3013 female at 4833w5d with active labor.   Plan:  1. Admit to Labor & Delivery; consents reviewed and obtained  2. Fetal Well being  - Fetal Tracing: category I - GBS negative - Presentation: cephalic confirmed by Leopold's   3. Routine OB: - Prenatal labs reviewed, as above - Rh positive - CBC & T&S on admit - Saline lock  4. Monitoring of Labor -  Pelvis proven  -  Plan for intermittent fetal monitoring  -  Maternal pain control as desired: pain management without medications -  Anticipate vaginal delivery  5. Post Partum Planning: - Infant feeding: breastfeeding - Contraception: BTL, timing TBD  Chelsea Hoffman, CNM 08/28/2017 8:16 PM ----- Chelsea DelMargaret Chelsea Hoffman Certified Nurse Midwife [facility], Department of OB/GYN [facility]

## 2017-08-28 NOTE — Progress Notes (Signed)
Chelsea Hoffman is a 37 y.o. female. She is at 212w5d gestation. Patient's last menstrual period was 11/23/2016. Estimated Date of Delivery: 08/30/2017  [facility]  Chief complaint: contractions Location: abdomen/uterus Onset/timing: more regularly in the past 1-2 hours Duration: intermittent, every 4-5 minutes Quality: cramping Severity: moderate Aggravating or alleviating conditions: none Associated signs/symptoms: none Context: Chelsea Hoffman has been having irregular contractions. She was seen in the office today and had her membranes swept by Heloise Ochoaebecca McVey, CNM. Since then, she started contracting more regularly, reporting contractions every 4-5 minutes at this time.   S: Resting comfortably.   She reports:  -active fetal movement -no leakage of fluid  -no vaginal bleeding  Maternal Medical History:  No past medical history on file.  Past Surgical History:  Procedure Laterality Date  . BREAST BIOPSY Right    neg    No Known Allergies  Prior to Admission medications   Medication Sig Start Date End Date Taking? Authorizing Provider  Prenatal Vit-Fe Fumarate-FA (MULTIVITAMIN-PRENATAL) 27-0.8 MG TABS tablet Take 1 tablet by mouth daily at 12 noon.   Yes [provider]     Social History: She  reports that she has never smoked. She has never used smokeless tobacco.  Family History: family history includes Breast cancer (age of onset: 2445) in her maternal grandmother; Breast cancer (age of onset: 4552) in her maternal aunt.   Review of Systems: A full review of systems was performed and negative except as noted in the HPI.    O:  LMP 11/23/2016  No results found for this or any previous visit (from the past 48 hour(s)).   Constitutional: NAD, AAOx3  HE/ENT: extraocular movements grossly intact, moist mucous membranes Ext: Non-tender, Nonedmeatous   Psych: mood appropriate, speech normal Pelvic: 4-5cm/80%/-2 soft/posterior 1742 by CNM  Chelsea Hoffman  NST:  Baseline: 130bpm Variability: moderate Accelerations: present x >2 Decelerations: absent Time 30mins  Toco: irregular contractions every 3-5 minutes  A/P: 37 y.o. 352w5d here for antenatal surveillance for contractions  Labor  Contracting every 3-5 minutes  Cervical exam unchanged from office exam 2 hours ago.   Plan to encourage ambulation and recheck cervix in 2 hours, or before if indicated.   Fetal Wellbeing  Reassuring Cat 1 tracing.  Genia DelMargaret Cia Garretson 08/28/2017 6:06 PM ----- Genia DelMargaret Chelsea Hoffman, CNM Certified Nurse Midwife [facility], Department of OB/GYN [facility]

## 2017-08-28 NOTE — Discharge Instructions (Signed)

## 2017-08-28 NOTE — OB Triage Note (Signed)
Monitors removed per provider for ambulation on unit.  Reactive NST

## 2017-08-28 NOTE — Discharge Summary (Signed)
Obstetric Discharge Summary   Patient Name: Chelsea Hoffman Renbarger DOB: [birth year] MRN: [id number]  Date of Admission: 08/28/2017 Date of Delivery: 08/28/2017 Delivered by: Genia DelMargaret Haviland, CNM Date of Discharge: 08/30/2017  [facility]  WGN:[id number]'MLMP:Patient's last menstrual period was 11/23/2016. EDC Estimated Date of Delivery: 08/30/17 Gestational Age at Delivery: 1134w5d   Antepartum complications:  1. AMA 2. Elevated 1h OGTT, 3h OGTT WNL  Admitting Diagnosis: active labor  Secondary Diagnoses: Patient Active Problem List   Diagnosis Date Noted  . Uterine contractions during pregnancy 08/28/2017  . Labor and delivery indication for care or intervention 08/28/2017    Augmentation: None Complications: Precipitous delivery  Intrapartum complications/course: Chelsea Hoffman Jemison presented to L&D with contractions. She expectantly managed. She had SROM for clear fluids and quickly progressed to complete and had Hoffman spontaneous vaginal birth of Hoffman live female over an intact perineum. The fetal head was delivered in direct OA position with restitution to ROA. No nuchal cord, right nuchal hand. Anterior then posterior shoulders delivered spontaneously. Baby placed on mom's abdomen and attended to by transition RN. Cord clamped and cut when pulseless by father of the baby, Oswaldo DoneHector. No lacerations. IM pitocin given for hemorrhage prophylaxis. EBL 50mL.  Delivery Type: spontaneous vaginal delivery Anesthesia: none Placenta: spontaneous Laceration: none Episiotomy: none  Newborn Data: Live born female "Chelsea Hoffman" Birth Weight: 3300g 7lb 4.4oz  APGAR: 9, 9   Newborn Delivery   Birth date/time:  08/28/2017 19:13:00 Delivery type:  Vaginal, Spontaneous      Postpartum Course  Patient had an uncomplicated postpartum course.  By time of discharge on PPD#2, her pain was controlled on oral pain medications; she had appropriate lochia and was ambulating, voiding without difficulty and  tolerating regular diet.  She was deemed stable for discharge to home.       Labs: CBC Latest Ref Rng & Units 08/29/2017 08/28/2017 06/10/2013  WBC 3.6 - 11.0 K/uL 12.9(H) 16.4(H) -  Hemoglobin 12.0 - 16.0 g/dL 57.812.8 46.913.4 -  Hematocrit 35.0 - 47.0 % 37.4 39.1 36.4  Platelets 150 - 440 K/uL 254 255 -   Hoffman POS  Physical exam:  BP 100/68 (BP Location: Left Arm)   Pulse 69   Temp 98.4 F (36.9 C) (Oral)   Resp 18   LMP 11/23/2016   SpO2 98%   Breastfeeding? Unknown  General: alert and no distress Pulm: normal respiratory effort Lochia: appropriate Abdomen: soft, NT Uterine Fundus: firm, below umbilicus Extremities: No evidence of DVT seen on physical exam. No lower extremity edema.   Disposition: stable, discharge to home Baby Feeding: breastmilk Baby Disposition: home with mom  Contraception: BTL  Prenatal Labs:  Blood type/Rh Hoffman+  Antibody screen neg  Rubella Immune  Varicella Immune  RPR NR  HBsAg Neg  HIV NR  GC neg  Chlamydia neg  Genetic screening declined  1 hour GTT 149  3 hour GTT 83, 141, 156, 136  GBS negative   Rh Immune globulin given: n/Hoffman Rubella vaccine given: n/Hoffman Tdap vaccine given in AP or PP setting: AP 05/29/17 Flu vaccine given in AP or PP setting: AP 09/2016  Plan:  Chelsea Hoffman [place] was discharged to home in good condition. [facility] delivery provider in 6 weeks  Discharge Instructions: Per After Visit Summary. Activity: Advance as tolerated. Pelvic rest for 6 weeks.   Diet: Regular Discharge Medications: Percocet  Outpatient follow up:  Follow-up Information    Genia DelHaviland, Margaret, CNM. Schedule  an appointment as soon as possible for Hoffman visit in 6 week(s).   Specialty:  Certified Nurse Midwife Why:  For routine postpartum visit. Contact information: [address] [place] [address] [phone]            Signed:  Christeen Douglas 08/30/17

## 2017-08-28 NOTE — OB Triage Note (Signed)
Patient arrived on L&D via wheelchair from providers office complaining of contractions and cramping after "I had my membranes stripped in the office just a few minutes ago."

## 2017-08-29 ENCOUNTER — Inpatient Hospital Stay: Payer: No Typology Code available for payment source | Admitting: Anesthesiology

## 2017-08-29 ENCOUNTER — Encounter
Admission: EM | Disposition: A | Discharge status: Home / Self Care | Payer: Self-pay | Source: Home / Self Care | Attending: Certified Nurse Midwife

## 2017-08-29 HISTORY — PX: TUBAL LIGATION: SHX77

## 2017-08-29 LAB — CBC
HEMATOCRIT: 37.4 % (ref 35.0–47.0)
Hemoglobin: 12.8 g/dL (ref 12.0–16.0)
MCH: 31 pg (ref 26.0–34.0)
MCHC: 34.2 g/dL (ref 32.0–36.0)
MCV: 90.7 fL (ref 80.0–100.0)
PLATELETS: 254 10*3/uL (ref 150–440)
RBC: 4.13 MIL/uL (ref 3.80–5.20)
RDW: 14.2 % (ref 11.5–14.5)
WBC: 12.9 10*3/uL — AB (ref 3.6–11.0)

## 2017-08-29 SURGERY — LIGATION, FALLOPIAN TUBE, POSTPARTUM
Anesthesia: General | Site: Abdomen | Laterality: Bilateral | Wound class: Clean

## 2017-08-29 MED ORDER — LIDOCAINE HCL (CARDIAC) PF 100 MG/5ML IV SOSY
PREFILLED_SYRINGE | INTRAVENOUS | Status: DC | PRN
  Administered 2017-08-29: 100 mg via INTRAVENOUS

## 2017-08-29 MED ORDER — BUPIVACAINE HCL (PF) 0.5 % IJ SOLN
INTRAMUSCULAR | Status: AC
  Filled 2017-08-29: qty 30

## 2017-08-29 MED ORDER — PROPOFOL 10 MG/ML IV BOLUS
INTRAVENOUS | Status: DC | PRN
  Administered 2017-08-29: 170 mg via INTRAVENOUS

## 2017-08-29 MED ORDER — SEVOFLURANE IN SOLN
RESPIRATORY_TRACT | Status: AC
  Filled 2017-08-29: qty 250

## 2017-08-29 MED ORDER — SUCCINYLCHOLINE CHLORIDE 20 MG/ML IJ SOLN
INTRAMUSCULAR | Status: DC | PRN
  Administered 2017-08-29: 120 mg via INTRAVENOUS

## 2017-08-29 MED ORDER — FENTANYL CITRATE (PF) 100 MCG/2ML IJ SOLN
INTRAMUSCULAR | Status: AC
  Filled 2017-08-29: qty 2

## 2017-08-29 MED ORDER — FENTANYL CITRATE (PF) 100 MCG/2ML IJ SOLN
INTRAMUSCULAR | Status: DC | PRN
  Administered 2017-08-29: 25 ug via INTRAVENOUS
  Administered 2017-08-29: 100 ug via INTRAVENOUS
  Administered 2017-08-29 (×2): 25 ug via INTRAVENOUS

## 2017-08-29 MED ORDER — KETOROLAC TROMETHAMINE 30 MG/ML IJ SOLN
INTRAMUSCULAR | Status: DC | PRN
  Administered 2017-08-29: 30 mg via INTRAVENOUS

## 2017-08-29 MED ORDER — IBUPROFEN 600 MG PO TABS
600.0000 mg | ORAL_TABLET | Freq: Four times a day (QID) | ORAL | Status: DC
  Administered 2017-08-29 – 2017-08-30 (×4): 600 mg via ORAL
  Filled 2017-08-29 (×4): qty 1

## 2017-08-29 MED ORDER — OXYCODONE HCL 5 MG PO TABS: 10.0000 mg | ORAL_TABLET | ORAL | Status: DC | PRN

## 2017-08-29 MED ORDER — ONDANSETRON HCL 4 MG/2ML IJ SOLN
INTRAMUSCULAR | Status: DC | PRN
  Administered 2017-08-29: 4 mg via INTRAVENOUS

## 2017-08-29 MED ORDER — ONDANSETRON HCL 4 MG/2ML IJ SOLN: 4.0000 mg | Freq: Once | INTRAMUSCULAR | Status: DC | PRN

## 2017-08-29 MED ORDER — BUPIVACAINE HCL 0.5 % IJ SOLN
INTRAMUSCULAR | Status: DC | PRN
  Administered 2017-08-29: 10 mL

## 2017-08-29 MED ORDER — OXYCODONE HCL 5 MG PO TABS: 5.0000 mg | ORAL_TABLET | ORAL | Status: DC | PRN

## 2017-08-29 MED ORDER — PROPOFOL 10 MG/ML IV BOLUS
INTRAVENOUS | Status: AC
  Filled 2017-08-29: qty 20

## 2017-08-29 MED ORDER — LACTATED RINGERS IV SOLN
INTRAVENOUS | Status: DC | PRN
  Administered 2017-08-29: 18:00:00 via INTRAVENOUS

## 2017-08-29 MED ORDER — FENTANYL CITRATE (PF) 100 MCG/2ML IJ SOLN: 25.0000 ug | INTRAMUSCULAR | Status: DC | PRN

## 2017-08-29 SURGICAL SUPPLY — 36 items
BLADE SURG SZ11 CARB STEEL (BLADE) ×2 IMPLANT
CELL SAVER LIPIGURD (MISCELLANEOUS) ×1 IMPLANT
CHLORAPREP W/TINT 26ML (MISCELLANEOUS) ×2 IMPLANT
DERMABOND ADVANCED (GAUZE/BANDAGES/DRESSINGS) ×1
DERMABOND ADVANCED .7 DNX12 (GAUZE/BANDAGES/DRESSINGS) ×1 IMPLANT
DRAPE LAPAROTOMY 100X77 ABD (DRAPES) ×2 IMPLANT
DRSG TEGADERM 2-3/8X2-3/4 SM (GAUZE/BANDAGES/DRESSINGS) ×2 IMPLANT
DRSG TELFA 4X3 1S NADH ST (GAUZE/BANDAGES/DRESSINGS) ×2 IMPLANT
ELECT CAUTERY BLADE 6.4 (BLADE) ×2 IMPLANT
ELECT REM PT RETURN 9FT ADLT (ELECTROSURGICAL) ×2
ELECTRODE REM PT RTRN 9FT ADLT (ELECTROSURGICAL) ×1 IMPLANT
EXTRT SYSTEM ALEXIS 14CM (MISCELLANEOUS) ×2
GLOVE PI ORTHOPRO 6.5 (GLOVE) ×1
GLOVE PI ORTHOPRO STRL 6.5 (GLOVE) ×1 IMPLANT
GLOVE SURG SYN 6.5 ES PF (GLOVE) ×2 IMPLANT
GOWN STRL REUS W/ TWL LRG LVL3 (GOWN DISPOSABLE) ×2 IMPLANT
GOWN STRL REUS W/TWL LRG LVL3 (GOWN DISPOSABLE) ×2
KIT TURNOVER CYSTO (KITS) ×2 IMPLANT
LABEL OR SOLS (LABEL) ×2 IMPLANT
LIGASURE VESSEL 5MM BLUNT TIP (ELECTROSURGICAL) IMPLANT
NEEDLE HYPO 22GX1.5 SAFETY (NEEDLE) ×2 IMPLANT
NS IRRIG 500ML POUR BTL (IV SOLUTION) ×2 IMPLANT
PACK BASIN MINOR ARMC (MISCELLANEOUS) ×2 IMPLANT
RETRACTOR RING XSMALL (MISCELLANEOUS) IMPLANT
RETRACTOR WOUND ALXS 18CM SML (MISCELLANEOUS) ×2 IMPLANT
RTRCTR WOUND ALEXIS 13CM XS SH (MISCELLANEOUS)
RTRCTR WOUND ALEXIS O 18CM SML (MISCELLANEOUS) ×4
SPONGE LAP 18X18 RF (DISPOSABLE) ×2 IMPLANT
SUT MNCRL AB 4-0 PS2 18 (SUTURE) ×2 IMPLANT
SUT VIC AB 2-0 CT1 27 (SUTURE) ×2
SUT VIC AB 2-0 CT1 TAPERPNT 27 (SUTURE) ×2 IMPLANT
SUT VIC AB 2-0 SH 27 (SUTURE) ×4
SUT VIC AB 2-0 SH 27XBRD (SUTURE) ×4 IMPLANT
SUT VIC AB 2-0 UR6 27 (SUTURE) ×2 IMPLANT
SYR 10ML LL (SYRINGE) ×2 IMPLANT
TRAY FOLEY MTR SLVR 16FR STAT (SET/KITS/TRAYS/PACK) ×2 IMPLANT

## 2017-08-29 NOTE — Anesthesia Preprocedure Evaluation (Addendum)
Anesthesia Evaluation  Patient identified by MRN, date of birth, ID band Patient awake    Reviewed: Allergy & Precautions, H&P , NPO status , Patient's Chart, lab work & pertinent test results, reviewed documented beta blocker date and time   Airway Mallampati: II  TM Distance: >3 FB Neck ROM: full    Dental  (+) Teeth Intact   Pulmonary neg pulmonary ROS,    Pulmonary exam normal        Cardiovascular negative cardio ROS Normal cardiovascular exam Rhythm:regular Rate:Normal     Neuro/Psych negative neurological ROS  negative psych ROS   GI/Hepatic negative GI ROS, Neg liver ROS,   Endo/Other  negative endocrine ROS  Renal/GU negative Renal ROS  negative genitourinary   Musculoskeletal   Abdominal   Peds  Hematology negative hematology ROS (+)   Anesthesia Other Findings No past medical history on file. Past Surgical History: No date: BREAST BIOPSY; Right     Comment:  neg   Reproductive/Obstetrics negative OB ROS                             Anesthesia Physical Anesthesia Plan  ASA: II and emergent  Anesthesia Plan: General ETT   Post-op Pain Management:    Induction:   PONV Risk Score and Plan:   Airway Management Planned:   Additional Equipment:   Intra-op Plan:   Post-operative Plan:   Informed Consent: I have reviewed the patients History and Physical, chart, labs and discussed the procedure including the risks, benefits and alternatives for the proposed anesthesia with the patient or authorized representative who has indicated his/her understanding and acceptance.   Dental Advisory Given  Plan Discussed with: CRNA  Anesthesia Plan Comments:        Anesthesia Quick Evaluation

## 2017-08-29 NOTE — Anesthesia Procedure Notes (Signed)
Procedure Name: Intubation Performed by: Lance Muss, CRNA Pre-anesthesia Checklist: Patient identified, Patient being monitored, Timeout performed, Emergency Drugs available and Suction available Patient Re-evaluated:Patient Re-evaluated prior to induction Oxygen Delivery Method: Circle system utilized Preoxygenation: Pre-oxygenation with 100% oxygen Induction Type: IV induction, Cricoid Pressure applied and Rapid sequence Laryngoscope Size: Mac and 3 Grade View: Grade I Tube type: Oral Tube size: 7.0 mm Number of attempts: 1 Airway Equipment and Method: Stylet Placement Confirmation: ETT inserted through vocal cords under direct vision,  positive ETCO2 and breath sounds checked- equal and bilateral Secured at: 21 cm Tube secured with: Tape Dental Injury: Teeth and Oropharynx as per pre-operative assessment

## 2017-08-29 NOTE — Op Note (Addendum)
  Post Partum Tubal Ligation  Indication for procedure: desired permanent sterilization  Pre-op diagnosis: s/p term vaginal delivery, desired permanent sterilization Post-op diagnosis: same Procedure: post partum bilateral tubal ligation (bilateral partial salpingectomy) Surgeon: Leeroy Bockhelsea Brigg Cape Assist: none Anesthesia: general  IVF: 300cc EBL: minimal UOP: none Findings: patent bilateral tubes, normal post-gravid uterine fundus Specimens: portion of left tube, portion of right tube Complications: none apparent Disposition: stable to PACU  Procedure in detail: The patient was seen and confirmed desire for permanent sterilization.  She was identified as Chelsea Hoffman and was brought to the OR.  General anesthesia was administered, and the patient was prepped and draped in the usual sterile fashion.  A surgical time-out was called.  An 11-blade was used to incise the skin in the infraumbilical area.  The subcutaneous tissues were dissected to and then through the fascia, and the peritoneum was grasped and divided.  An alexis retractor was placed in the abdominal cavity and positioned.  The uterus was identified, and the right tube was grasped with a babcock and traced to the fimbriated end.  A kelly clamp was placed along the mesosalpinx and metzenbaums were used to transect the tube from the underlying tissue.  2-0 Vicryl was used to tie the remaining tissue.  The same steps were used on the left side.  All dissected ends were found to be hemostatic.  The alexis retractor was removed, and the fascia was reapproximated with 2-0 vicryl.  The subcutaneous tissue was irrigated and then the skin was closed with 4-0 monocryl.  The skin was covered in surgical glue.  The sponge, needle, and instrument counts were correct x2.  The patient tolerated the procedure and was brought to PACU in a stable condition.  Ranae Plumberhelsea Christohper Dube, MD Attending Obstetrician and Gynecologist [facility]

## 2017-08-29 NOTE — Progress Notes (Signed)
Post Partum Day 1 Subjective: Doing well, no complaints.  Tolerating regular diet, pain with PO meds, voiding and ambulating without difficulty.  No CP SOB F/C N/V or leg pain no HA change of vision, RUQ/epigastric pain  Objective: BP 106/75 (BP Location: Left Arm)   Pulse 72   Temp 98.3 F (36.8 C) (Oral)   Resp 20   LMP 11/23/2016   SpO2 99%   Breastfeeding? Unknown    Physical Exam:  General: NAD CV: RRR Pulm: nl effort, CTABL Lochia: moderate Uterine Fundus: fundus firm and @ umbilicus DVT Evaluation: no cords, ttp LEs   Recent Labs    08/28/17 2024 08/29/17 0523  HGB 13.4 12.8  HCT 39.1 37.4  WBC 16.4* 12.9*  PLT 255 254    Assessment/Plan: 37 y.o. [id number] postpartum day # 1  1. Would like tubal - has been NPO since 8:45am.  Stay NPO and the tubal is scheduled for 6pm.  Hold ibuprofen.  2. Doing well postpartum 3. Lactation support 4. Continue inpatient admission.    ----- Ranae Plumberhelsea Ward, MD Attending Obstetrician and Gynecologist [facility]

## 2017-08-29 NOTE — Anesthesia Post-op Follow-up Note (Signed)
Anesthesia QCDR form completed.        

## 2017-08-29 NOTE — Transfer of Care (Signed)
Immediate Anesthesia Transfer of Care Note  Patient: Chelsea Hoffman  Procedure(s) Performed: POST PARTUM TUBAL LIGATION (Bilateral Abdomen)  Patient Location: PACU  Anesthesia Type:General  Level of Consciousness: awake and responds to stimulation  Airway & Oxygen Therapy: Patient Spontanous Breathing and Patient connected to face mask oxygen  Post-op Assessment: Report given to RN and Post -op Vital signs reviewed and stable  Post vital signs: Reviewed and stable  Last Vitals:  Vitals Value Taken Time  BP 125/90 08/29/2017  7:24 PM  Temp    Pulse 74 08/29/2017  7:24 PM  Resp 14 08/29/2017  7:24 PM  SpO2 95 % 08/29/2017  7:24 PM  Vitals shown include unvalidated device data.  Last Pain:  Vitals:   08/29/17 1800  TempSrc:   PainSc: 0-No pain         Complications: No apparent anesthesia complications

## 2017-08-30 ENCOUNTER — Inpatient Hospital Stay
Admission: RE | Admit: 2017-08-30 | Payer: No Typology Code available for payment source | Source: Ambulatory Visit | Admitting: *Deleted

## 2017-08-30 ENCOUNTER — Encounter: Payer: Self-pay | Admitting: Obstetrics & Gynecology

## 2017-08-30 LAB — RPR: RPR: NONREACTIVE

## 2017-08-30 MED ORDER — ACETAMINOPHEN 325 MG PO TABS: 650.0000 mg | ORAL_TABLET | ORAL | 0 refills | Status: AC | PRN

## 2017-08-30 MED ORDER — OXYCODONE HCL 5 MG PO TABS: 5.0000 mg | ORAL_TABLET | ORAL | 0 refills | Status: AC | PRN

## 2017-08-30 MED ORDER — IBUPROFEN 600 MG PO TABS: 600.0000 mg | ORAL_TABLET | Freq: Four times a day (QID) | ORAL | 0 refills | Status: AC

## 2017-08-30 NOTE — Progress Notes (Signed)
Reviewed all patients discharge instructions and handouts regarding postpartum bleeding, no intercourse for 6 weeks, signs and symptoms of mastitis and postpartum bleu's. Reviewed discharge instructions for newborn regarding proper cord care, how and when to bathe the newborn, nail care, proper way to take the baby's temperature, along with safe sleep. questions have been answered at this time. Patient discharged via wheelchair with axillary.

## 2017-08-31 LAB — SURGICAL PATHOLOGY

## 2017-09-13 NOTE — Anesthesia Postprocedure Evaluation (Signed)
Anesthesia Post Note  Patient: Chelsea Hoffman  Procedure(s) Performed: POST PARTUM TUBAL LIGATION (Bilateral Abdomen)  Patient location during evaluation: PACU Anesthesia Type: General Level of consciousness: awake and alert Pain management: pain level controlled Vital Signs Assessment: post-procedure vital signs reviewed and stable Respiratory status: spontaneous breathing, nonlabored ventilation, respiratory function stable and patient connected to nasal cannula oxygen Cardiovascular status: blood pressure returned to baseline and stable Postop Assessment: no apparent nausea or vomiting Anesthetic complications: no     Last Vitals:  Vitals:   08/30/17 0817 08/30/17 1609  BP: 100/68 110/77  Pulse: 69 78  Resp: 18 18  Temp: 36.9 C 37.2 C  SpO2: 98% 97%    Last Pain:  Vitals:   08/30/17 1609  TempSrc: Oral  PainSc:                  Yevette EdwardsJames G Adams

## 2017-10-02 ENCOUNTER — Other Ambulatory Visit: Payer: Self-pay | Admitting: Certified Nurse Midwife

## 2017-10-02 DIAGNOSIS — N6314 Unspecified lump in the right breast, lower inner quadrant: Secondary | ICD-10-CM

## 2017-10-03 ENCOUNTER — Other Ambulatory Visit: Payer: Self-pay | Admitting: Certified Nurse Midwife

## 2017-10-03 DIAGNOSIS — N6314 Unspecified lump in the right breast, lower inner quadrant: Secondary | ICD-10-CM

## 2017-10-09 ENCOUNTER — Ambulatory Visit
Admission: RE | Admit: 2017-10-09 | Discharge: 2017-10-09 | Disposition: A | Discharge status: Home / Self Care | Payer: No Typology Code available for payment source | Source: Ambulatory Visit | Attending: Certified Nurse Midwife | Admitting: Certified Nurse Midwife

## 2017-10-09 ENCOUNTER — Encounter (HOSPITAL_COMMUNITY): Payer: Self-pay

## 2017-10-09 DIAGNOSIS — N6314 Unspecified lump in the right breast, lower inner quadrant: Secondary | ICD-10-CM

## 2017-10-09 IMAGING — MG MM DIGITAL DIAGNOSTIC BILAT W/ TOMO W/ CAD
6 of 10 series · 6 of 30 positions shown · non-contrast
Comparison: Previous exam(s).

CLINICAL DATA: 37-year-old breast-feeding female with palpable
RIGHT breast mass identified on self-examination. History of remote
benign RIGHT breast surgery.

EXAM:
DIGITAL DIAGNOSTIC BILATERAL MAMMOGRAM WITH CAD AND TOMO
ULTRASOUND RIGHT BREAST

[R TAN synth-2D]
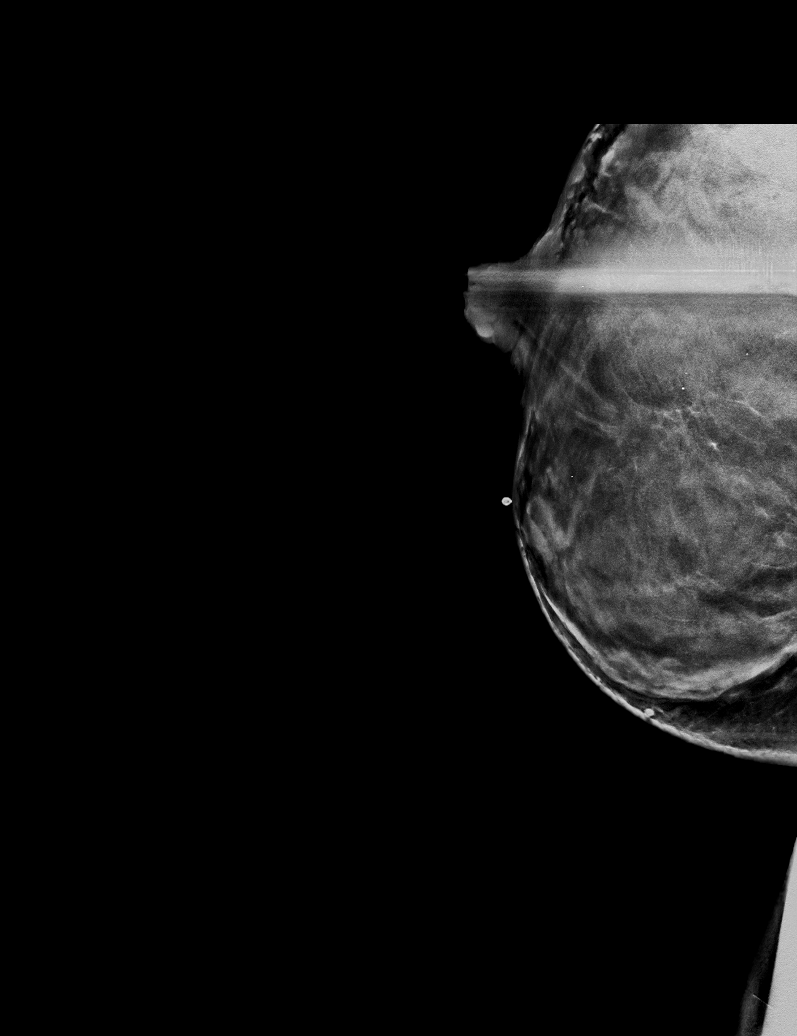

[R MLO synth-2D]
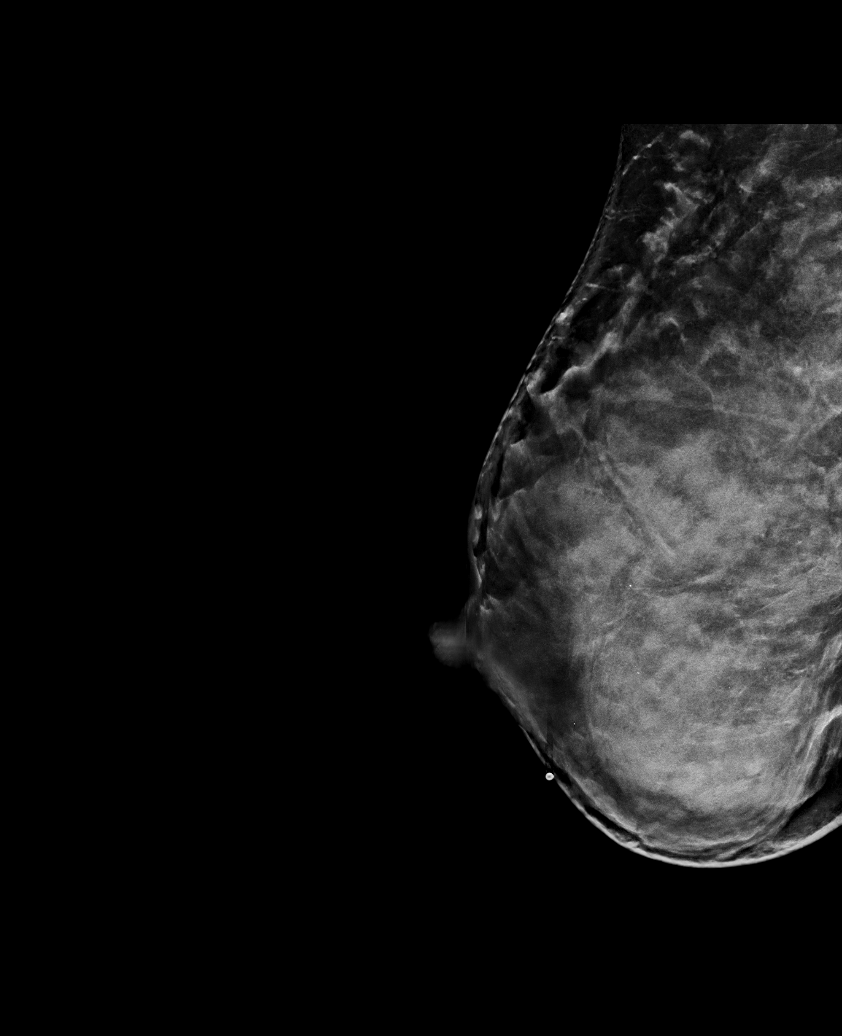

[L CC synth-2D]
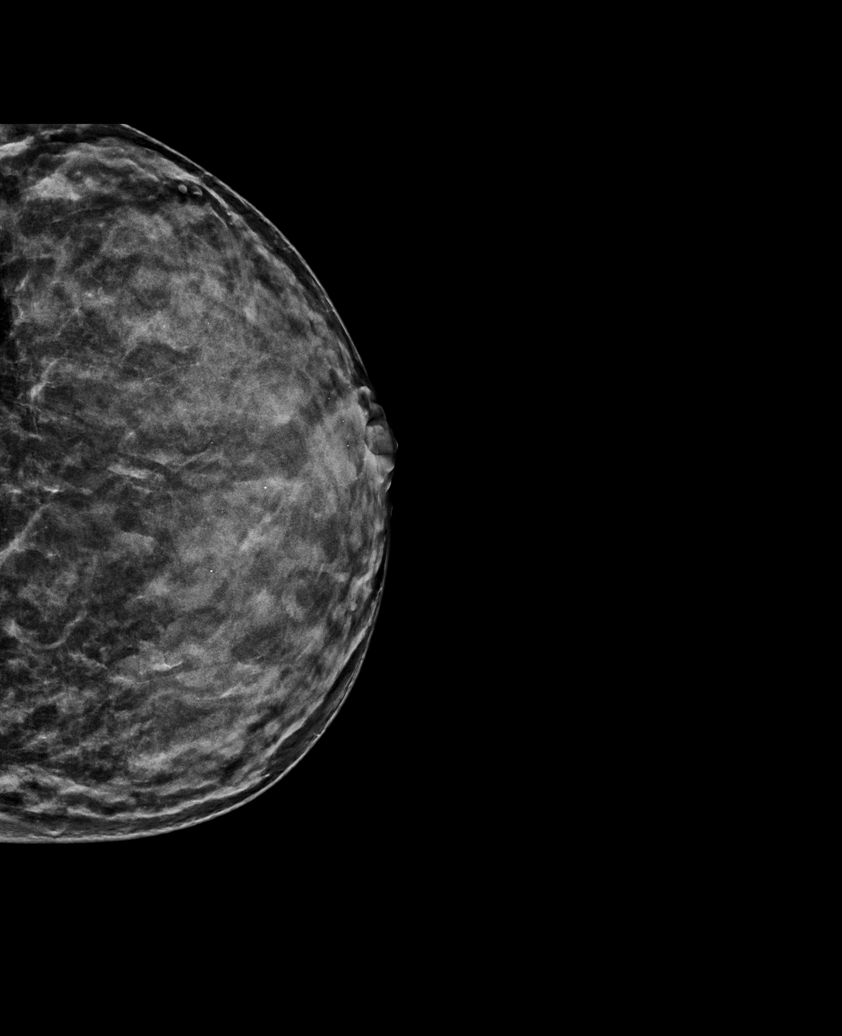

[R CC synth-2D]
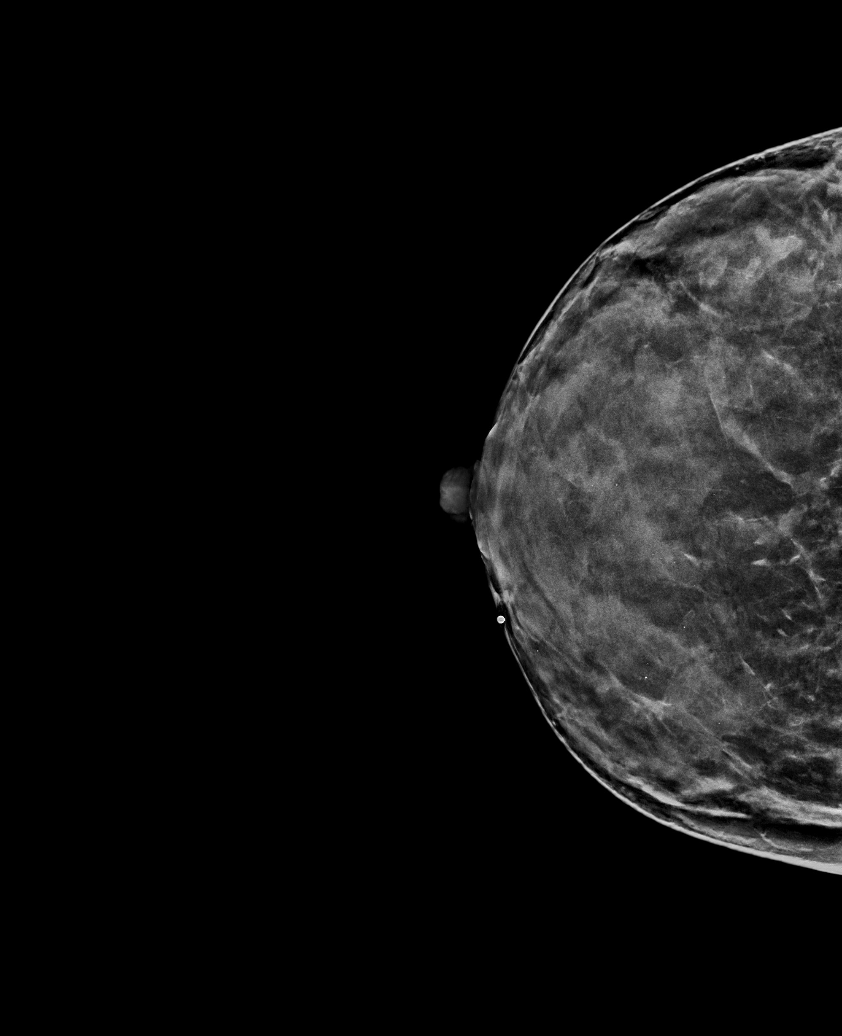

[L MLO synth-2D]
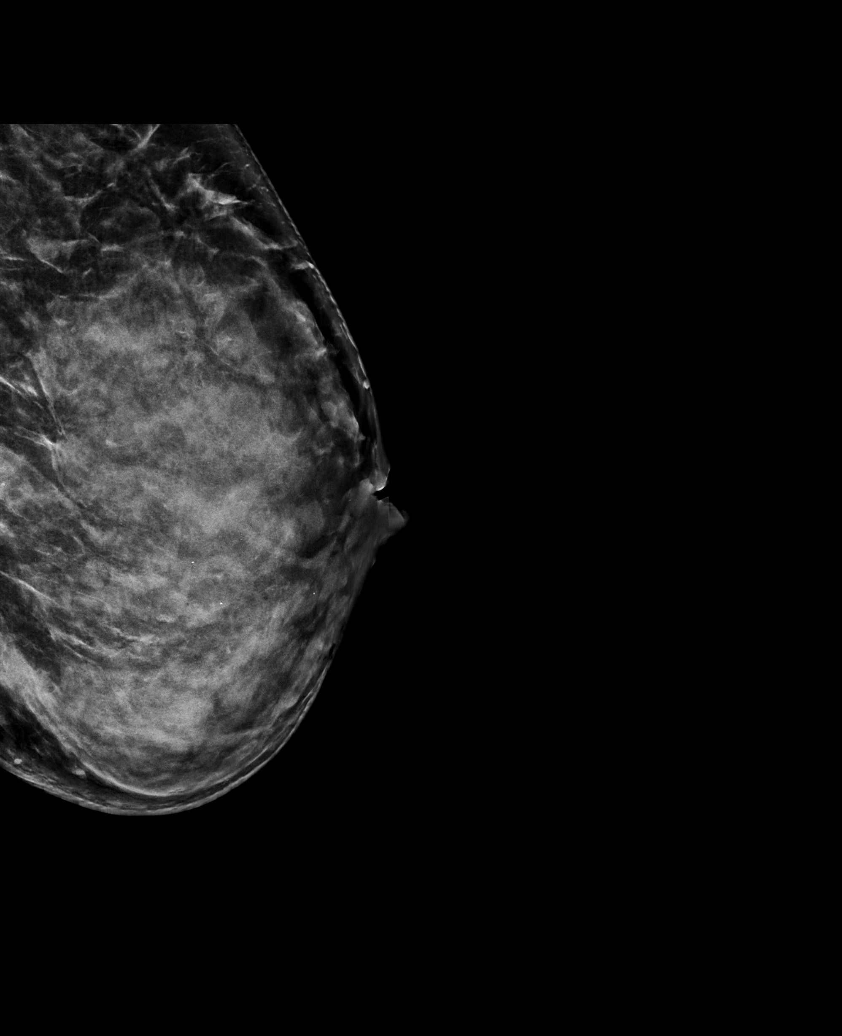

[R MLO tomo · tomo slice 45/89.0]
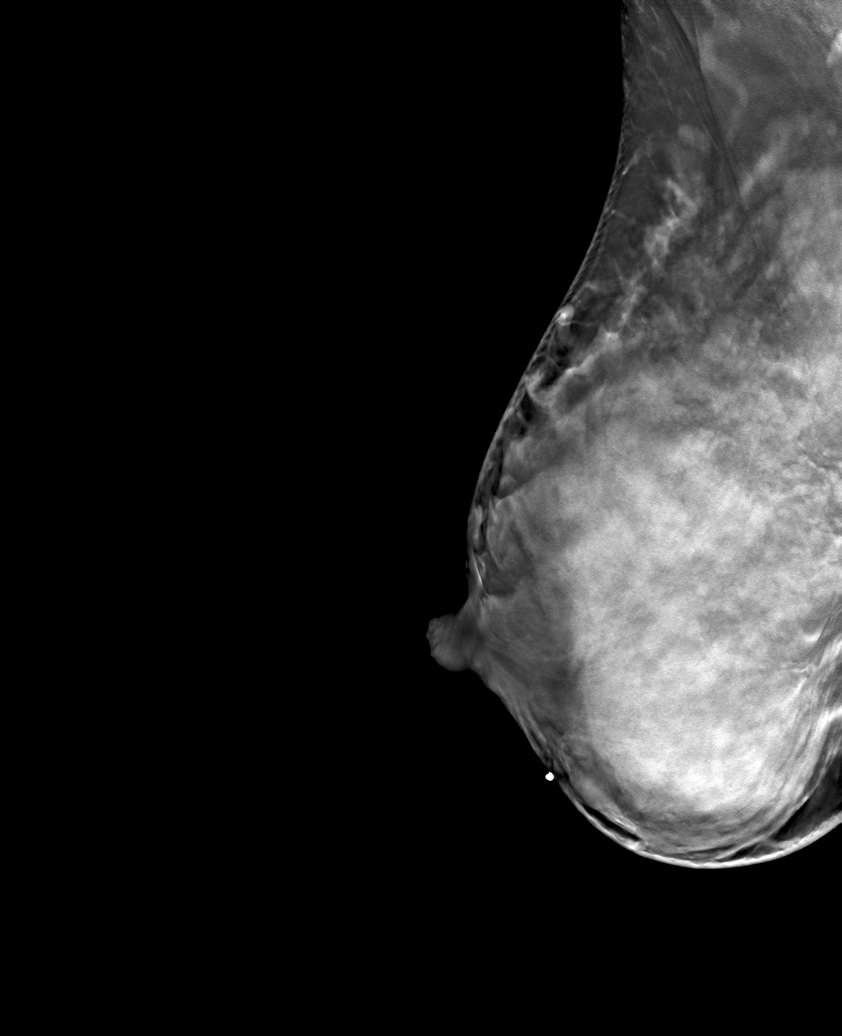

[6 of 30 positions shown; findings below may reference images not displayed]

ACR Breast Density Category d: The breast tissue is extremely dense,
which lowers the sensitivity of mammography.
FINDINGS: 2D/3D full field views of both breasts and a spot compression view
of the RIGHT breast demonstrate a circumscribed oval mass within the
LOWER INNER RIGHT breast.

No other mass, nonsurgical distortion or worrisome calcifications
noted.

Mammographic images were processed with CAD.

On physical exam, a small firm palpable mobile mass identified at
the 4 o'clock position of the RIGHT breast 3 cm from the nipple.

Targeted ultrasound is performed, showing a 1 x 1.1 x 1 cm benign
cyst at the 4 o'clock position of the RIGHT breast 3 cm from the
nipple, corresponding to the palpable abnormality.
IMPRESSION: 1. Benign cyst within the LOWER INNER RIGHT breast, corresponding to
the patient's palpable abnormality.
2. No mammographic evidence of breast malignancy otherwise.

RECOMMENDATION:
Bilateral screening mammogram at age 40

I have discussed the findings and recommendations with the patient.
Results were also provided in writing at the conclusion of the
visit. If applicable, a reminder letter will be sent to the patient
regarding the next appointment.

BI-RADS CATEGORY  2: Benign.

## 2018-10-22 IMAGING — US US BREAST*R* LIMITED INC AXILLA
1 series · 5 of 5 positions shown · non-contrast
Comparison: Previous exam(s).

CLINICAL DATA: 37-year-old breast-feeding female with palpable
RIGHT breast mass identified on self-examination. History of remote
benign RIGHT breast surgery.

EXAM:
DIGITAL DIAGNOSTIC BILATERAL MAMMOGRAM WITH CAD AND TOMO
ULTRASOUND RIGHT BREAST

[Series 1: us breast*right* limited inc axilla · 0.06mm/px · 5 of 5 slices shown]
[im 1/5]
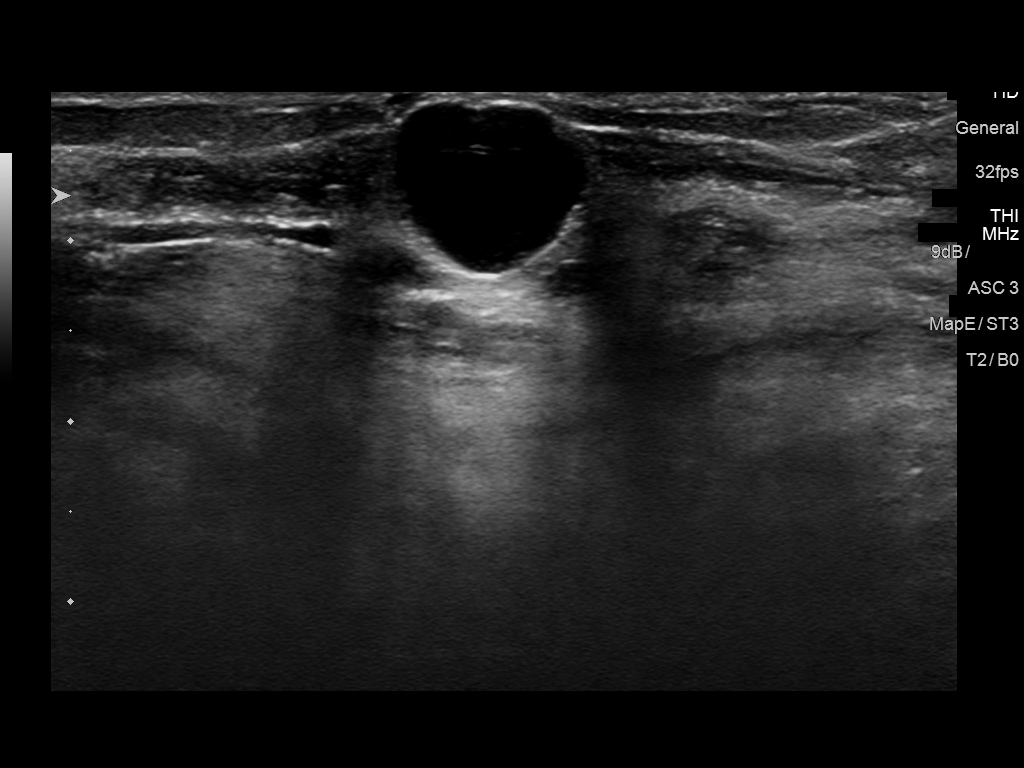
[im 2/5]
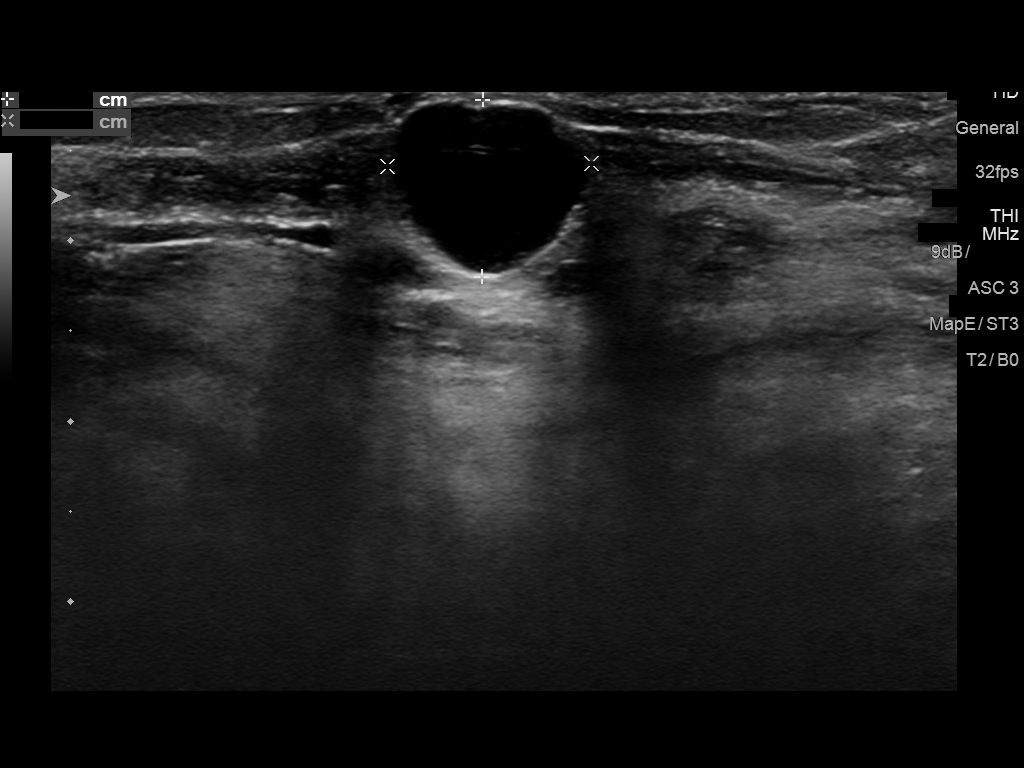
[im 3/5]
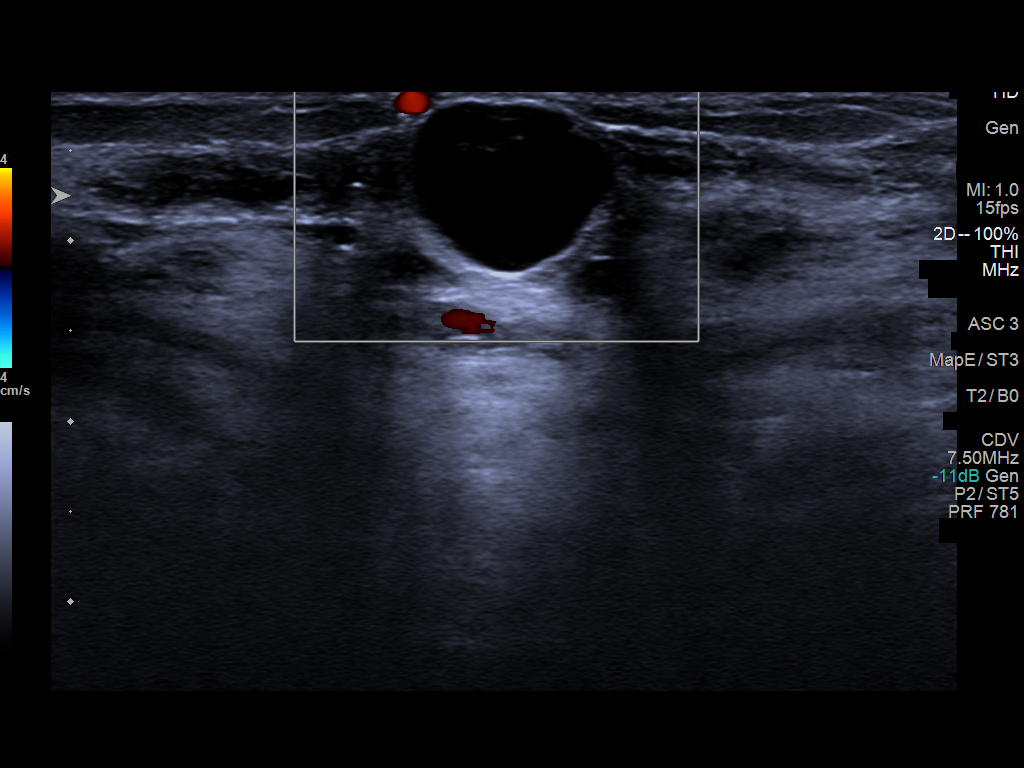
[im 4/5]
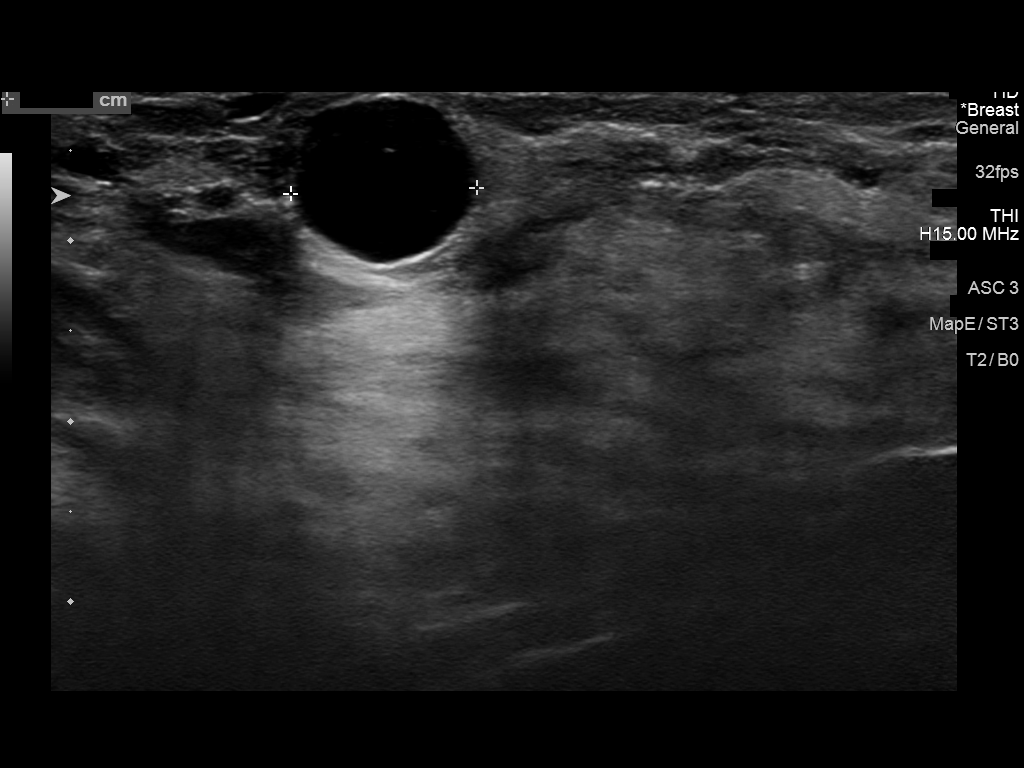
[im 5/5]
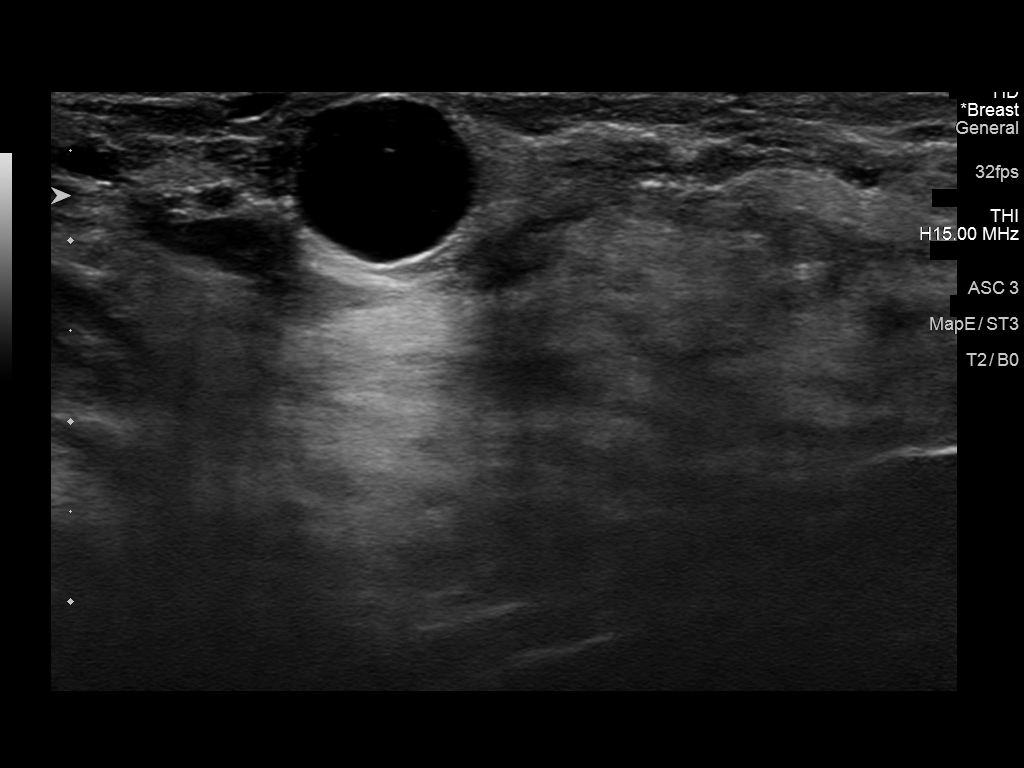

[5 of 5 positions shown; findings below may reference images not displayed]

ACR Breast Density Category d: The breast tissue is extremely dense,
which lowers the sensitivity of mammography.
FINDINGS: 2D/3D full field views of both breasts and a spot compression view
of the RIGHT breast demonstrate a circumscribed oval mass within the
LOWER INNER RIGHT breast.

No other mass, nonsurgical distortion or worrisome calcifications
noted.

Mammographic images were processed with CAD.

On physical exam, a small firm palpable mobile mass identified at
the 4 o'clock position of the RIGHT breast 3 cm from the nipple.

Targeted ultrasound is performed, showing a 1 x 1.1 x 1 cm benign
cyst at the 4 o'clock position of the RIGHT breast 3 cm from the
nipple, corresponding to the palpable abnormality.
IMPRESSION: 1. Benign cyst within the LOWER INNER RIGHT breast, corresponding to
the patient's palpable abnormality.
2. No mammographic evidence of breast malignancy otherwise.

RECOMMENDATION:
Bilateral screening mammogram at age 40

I have discussed the findings and recommendations with the patient.
Results were also provided in writing at the conclusion of the
visit. If applicable, a reminder letter will be sent to the patient
regarding the next appointment.

BI-RADS CATEGORY  2: Benign.

## 2018-11-14 ENCOUNTER — Ambulatory Visit: Payer: PRIVATE HEALTH INSURANCE | Attending: Certified Nurse Midwife

## 2018-11-14 ENCOUNTER — Other Ambulatory Visit: Payer: Self-pay

## 2018-11-14 DIAGNOSIS — M545 Low back pain: Secondary | ICD-10-CM

## 2018-11-14 DIAGNOSIS — M6208 Separation of muscle (nontraumatic), other site: Secondary | ICD-10-CM | POA: Diagnosis present

## 2018-11-14 DIAGNOSIS — R293 Abnormal posture: Secondary | ICD-10-CM | POA: Diagnosis present

## 2018-11-14 DIAGNOSIS — G8929 Other chronic pain: Secondary | ICD-10-CM | POA: Diagnosis present

## 2018-11-14 NOTE — Therapy (Signed)
[facility] MAIN [facility] SERVICES [address] [place], [place], [address] Phone: [phone]   Fax:  [phone]  Physical Therapy Evaluation The patient has been informed of current processes in place at Outpatient Rehab to protect patients from Covid-19 exposure including social distancing, schedule modifications, and new cleaning procedures. After discussing their particular risk with a therapist based on the patient's personal risk factors, the patient has decided to proceed with in-person therapy.  Patient Details  Name: Chelsea Hoffman MRN: [id number] Date of Birth: 01-15-1981 No data recorded  Encounter Date: 11/14/2018  PT End of Session - 11/14/18 1010    Visit Number  1    Number of Visits  10    Date for PT Re-Evaluation  01/23/19    PT Start Time  0900    PT Stop Time  1000    PT Time Calculation (min)  60 min    Activity Tolerance  Patient tolerated treatment well;No increased pain    Behavior During Therapy  [facility] for tasks assessed/performed       History reviewed. No pertinent past medical history.  Past Surgical History:  Procedure Laterality Date  . BREAST BIOPSY Right    neg  . TUBAL LIGATION Bilateral 08/29/2017   Procedure: POST PARTUM TUBAL LIGATION;  Surgeon: Ward, Elenora Fender, MD;  Location: [facility] ORS;  Service: Gynecology;  Laterality: Bilateral;    There were no vitals filed for this visit.  Pelvic Floor Physical Therapy Evaluation and Assessment  SCREENING  Falls in last 6 mo: Yes, slipped in August on a water surface into the kiddy pool.   Interpreter Needed?: No Interpreter Agency:  Interpreter Name  Interpreter ID  Patient Declined Interpreter   Patient signed [facility] waiver    Patient's communication preference:   Red Flags: Have you had any night sweats? No  Unexplained weight loss? No  Saddle anesthesia? No  Unexplained changes in bowel or bladder habits? No   SUBJECTIVE  Patient  reports: Patient reports she had diastic recti following the birth of her fourth daughter (now 11 months). At her annual this year, they noticed she continues to have some seperation. That patient shares she feels concerns that she is not quite "back to normal". She reports occasional urge related leakage (only 1-2 drops) and it is not consistent. She reports occasional low back pain, usually when bending or lifting. Patient shares she knew not to do crunches, as that would increase her DR. She says she does try to exercise reguarly- as it is a form of her stress release. She is a full time employee and full time Mom. Patient shares she does walking with small weights (treadmill) and some at home exercises.    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   Her primary goals are to get stronger, and to stop feeling "weakness".   Precautions:  -NA at this time.   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 (just 1-2 drops occasionally)   Gastrointestinal History: NA - will follow up at future sessions.   Sexual activity/pain: No pain with sex.   Location  of pain: occ'l low back pain/ tail bone  Current pain:  0/10  Max pain:  4/10 Least pain:  0/10 Nature of pain: achy   Patient Goals: Be able to close the DR gap, and strengthen the back.    OBJECTIVE  Posture/Observations:  Sitting: small thoracic kyphosis  Standing: R hip up-slip small anterior pelvic tilt R is slightly longer (lowers ASIS in supine)   Palpation/Segmental Motion/Joint Play: TTP to B illiacus, psoas, pectineus, piriformis.   Delayed anticipatory postural musculature with hip extension in prone (order of activation- glutes, hamstrings, multifidi)   Special tests:   SLR- small compensation   Range of Motion/Flexibilty:  Spine: approx 35% of rotation; WFL of side bending and forward flexion.  Hips:    Strength/MMT: deferred to follow-up  LE MMT  LE MMT Left Right  Hip flex:  (L2) /5 /5  Hip ext: /5 /5  Hip abd: /5 /5  Hip add: /5 /5  Hip IR /5 /5  Hip ER /5 /5     Abdominal:  Palpation: decreased fascial tension along QL  Diastasis: 1 finger, closes with abdominal pressure; less closure with SLR   Pelvic Floor External Exam: deferred to follow-up  Introitus Appears:  Skin integrity:  Palpation: Cough: Prolapse visible?: Scar mobility:  Internal Vaginal Exam: Strength (PERF):  Symmetry: Palpation: Prolapse:   Internal Rectal Exam: Strength (PERF): Symmetry: Palpation: Prolapse:   Gait Analysis: small hip hike on R; gait speed 1.3225m/s    Pelvic Floor Outcome Measures: PFIQ 5.55/300 (minimal deficits, small urgency); Female NIH 0 (no deficits)   INTERVENTIONS THIS SESSION: Theract: Patient education and performance of diaphragmatic breathing in hook-lying (preference for accessory muscule use; benefited from tactile cues), log rolling for supine<>sit (with breathing coordination). Further education on body mechanics for lifting and sitting/standing posture.(20 minutes)   Self Care: Educated on the structure and function of the pelvic floor in relation to their symptoms (soda can analogy and relationship of postural stability, diaphragm, PFM and DR)  as well as the POC, and initial HEP in order to set patient expectations and understanding from which we will build on in the future sessions. (20 minutes)      Total time: 60 minutes                   Objective measurements completed on examination: See above findings.                PT Short Term Goals  - 11/14/18 1029      PT SHORT TERM GOAL #1   Title  Patient will demonstrate a coordinated contraction, relaxation, and bulge of the pelvic floor muscles to demonstrate functional recruitment and motion and allow for further strengthening.    Baseline  Pt demonstrates accessory muscle breathing in shoulders/chest; decreased rib mobility. DR (1 finger seperation at rest).    Time  5    Period  Weeks    Status  New    Target Date  12/19/18      PT SHORT TERM GOAL #2   Title  Patient will demonstrate a coordinated contraction, relaxation, and bulge of the pelvic floor muscles to demonstrate functional recruitment and motion and allow for further strengthening.    Baseline  Pt demostrates increased intraabdominal pressure with functional activites.    Time  5    Period  Weeks    Status  New    Target Date  12/19/18      PT SHORT TERM  GOAL #3   Title  Patient will demonstrate appropriate body mechanics with bending and lifting heavy objects to allow for decreased stress on the pelvic floor and low back.    Baseline  Pt has occasional pain while lifting children (42 year old especially).    Time  5    Period  Weeks    Status  New    Target Date  12/19/18        PT Long Term Goals - 11/14/18 1031      PT LONG TERM GOAL #1   Title  Patient will be independent with HEP and stress-management routines in order to return to PLOF.    Baseline  Pt will benenfit from knowledge of HEP that can be progress/regressed to match goals and not increase DR.    Time  10    Period  Weeks    Status  New    Target Date  01/23/19      PT LONG TERM GOAL #2   Title  Pt will demo decreased abdominal separation from 1 fingers width to < 1 fingers width in order to progress to higher functional exercise routines w/ decreased risk for injuries.    Baseline  1 finger seperation at rest; closes with crunch; no TA activation with SLR.    Time  10    Period  Weeks    Status  New    Target Date  01/23/19              Plan - 11/14/18 1011    Clinical Impression Statement  Patient is a 38 y.o female who arrives to PT with c/o diastis recti following delivery postpartum (15 months). Patient has c/o of occasional urgency incontinence that is inconssitent. Patient has a releavant past medical history of 5 grava, 4 pregenaies, 4 live births (4 daughters 35, 97, 5, and 15 months), with tubal ligation following last delivery.   Due to possible functional leg length discrpency, pelvic obliquites, 1 finger diastis recti, and delayed anticipatory response of mulitifidi (postural stabilizing muscles). Patient will benefit from skilled pelvic health PT to address functional mobility, strength the deep core musculature, address minimal pain and occasional urge incontinene and education for contiued strengthening to prevent future injuries.   Pt. Responded well to all interventions today, demonstrating improved diaphragmatic breathing (less intraabdominal pressure with functional movements), improved education of symptoms,  as well as understanding and correct performance of all education and exercises provided today.    Personal Factors and Comorbidities  Time since onset of injury/illness/exacerbation    Examination-Activity Limitations  Carry;Transfers    Examination-Participation Restrictions  Community Activity    Stability/Clinical Decision Making  Stable/Uncomplicated    Clinical Decision Making  Low    Rehab Potential  Good    PT Frequency  1x / week    PT Duration  Other (comment)   10 weeks   PT Treatment/Interventions  ADLs/Self Care Home Management;Biofeedback;Electrical Stimulation;Moist Heat;Gait training;Stair training;Functional mobility training;Therapeutic activities;Therapeutic exercise;Balance training;Neuromuscular re-education;Patient/family education;Manual techniques;Energy conservation;Joint Manipulations;Spinal Manipulations    PT Next Visit Plan  reassess pelvic obliquities and  possible LLD; reassess diaphragmatic breathing, sit <>stand, log rolling, progress core engagement in hooklying; address trigger points; future: Fine tuning exercise program; assess treadmill use.    PT Home Exercise Plan  diaphragmatic breathing, optimizing supine<>sit, sit<>stand; body mechanics handout    Consulted and Agree with Plan of Care  Patient       Patient will benefit from skilled therapeutic intervention  in order to improve the following deficits and impairments:  Abnormal gait, Decreased coordination, Decreased balance, Decreased range of motion, Decreased strength, Impaired flexibility, Improper body mechanics, Postural dysfunction, Pain  Visit Diagnosis: Diastasis recti  Chronic bilateral low back pain, unspecified whether sciatica present  Physical therapy evaluation, initial     Problem List Patient Active Problem List   Diagnosis Date Noted  . Uterine contractions during pregnancy 08/28/2017  . Labor and delivery indication for care or intervention 08/28/2017    Kyanne Rials, SPT  11/14/2018, 10:59 AM  This entire session was performed under direct supervision and direction of a licensed therapist/therapist assistant . I have personally read, edited and approve of the note as written. Cleophus Molt DPT, ATC   [place] [facility] MAIN [facility] SERVICES [address] [place], [place], [address] Phone: [phone]   Fax:  [phone]  Name: MACKINSEY PELLAND MRN: [id number] Date of Birth: 09-08-1980

## 2018-11-14 NOTE — Patient Instructions (Signed)
Stabilization: Diaphragmatic Breathing    Lie with knees bent, feet flat. Place one hand on stomach, other on chest. Breathe deeply through nose, lifting belly hand without any motion of hand on chest. Repeat ___10_ times per set. Do __2__ sets per session. Do __5__ sessions per week.  * goal to belly breath- not using your chest/shoulders to breath   Body Mechanics handout     Sit to Stand Practice:  Sit, feet flat, scoot forward to the edge of the chair. Inhale as you bend forward at hips, begin to exhale just before and while you stand, contracting the glutes, lower tummy muscles and pelvic floor as if stopping urination as you stand up.   * Do this every time you sit or stand! If you catch yourself doing it "wrong, re-set and do it again so it can become habit!    Log Rolling Practice - When getting in or out of bed, first roll onto your side, then drop feet down, do a small inhale, then as your exhale, push your torso up.  - key- not adding additional pressure into your abdomen    Your "usual posture" looks like the kyphotic-lordoic picture above, we want it to look more like the first picture labeled "normal" Where the earlobe, tip of the shoulder, hip, and knee are all in a straight line.  Make sure that your knees are un-locked but not bent.  Draw up as if an imaginary string were tied to the crown of your head stretching you up tall. Gently pull shoulders back and down without "pushing" chest out. Pull chin in slightly as if you were a turtle pulling into your shell until ears line up with tips of shoulders.

## 2018-11-21 ENCOUNTER — Other Ambulatory Visit: Payer: Self-pay

## 2018-11-21 ENCOUNTER — Ambulatory Visit: Payer: PRIVATE HEALTH INSURANCE

## 2018-11-21 DIAGNOSIS — M545 Low back pain: Secondary | ICD-10-CM | POA: Diagnosis not present

## 2018-11-21 DIAGNOSIS — G8929 Other chronic pain: Secondary | ICD-10-CM

## 2018-11-21 DIAGNOSIS — M6208 Separation of muscle (nontraumatic), other site: Secondary | ICD-10-CM

## 2018-11-21 DIAGNOSIS — R293 Abnormal posture: Secondary | ICD-10-CM

## 2018-11-21 NOTE — Patient Instructions (Addendum)
1. Pelvic Tilt With Pelvic Floor (Hook-Lying)- building on the belly breathing.         Lie with hips and knees bent. Squeeze pelvic floor and flatten low back while breathing out so that pelvis tilts. Repeat __10_ times. Do __2_ times a day.  2. Seated version of above: Put a pillow under your hips in this position when doing this exercise to help decrease pressure in the pelvis when it feels "heavy".     Do 2 sets of 15 tilts per day. Breathe in when you tilt forward (A) and out when you tuck under (B).    Tips for work and couch posture: When seated, you want to maintain pelvic neutral with the shoulders gently down and back and ears in line with your shoulders. A lumbar roll such as the one below or a home-made towel-roll can be used for this purpose. Even Olympic athletes can only maintain proper seated posture for about 10 minutes without support!    Pictured: The Original [employer] Lumbar Roll     Running Mechanics: Start by leaning forward as a single unit then "catching" yourself to find the angle of forward lean that is appropriate. Minimize "bobbing" up and down and take faster, slightly shorter steps as if you are just repeatedly catching yourself as you lean forward rather than trying to "push" yourself forward.  --Tips- cut your running time down by half.   Good Cues: make sure the water bucket is neutral; check in with your feet, are they quiet?  Good tips: 180 beat per minute music   Ex) "Fast Pop Run 180 BPM on [employer]"  Ex) "180 BPM Workout on [employer]"  Tips for Holding the Kids:  **Baby wearing  ** Switch hips when carrying baby- so one hip is not always doing the work. Check in with posture throughout the day (where is the "bucket of water"?)

## 2018-11-21 NOTE — Therapy (Addendum)
[place] MAIN [facility] SERVICES [address] [place], [place], [address] Phone: [phone]   Fax:  [phone]  Physical Therapy Treatment The patient has been informed of current processes in place at Outpatient Rehab to protect patients from Covid-19 exposure including social distancing, schedule modifications, and new cleaning procedures. After discussing their particular risk with a therapist based on the patient's personal risk factors, the patient has decided to proceed with in-person therapy.  Patient Details  Name: Chelsea Hoffman MRN: [id number] Date of Birth: 03-07-1980 No data recorded  Encounter Date: 11/21/2018  PT End of Session - 11/21/18 1008    Visit Number  2    Number of Visits  10    Date for PT Re-Evaluation  01/23/19    Authorization - Visit Number  2    Authorization - Number of Visits  10    PT Start Time  0905    PT Stop Time  1000    PT Time Calculation (min)  55 min    Activity Tolerance  Patient tolerated treatment well;No increased pain    Behavior During Therapy  [facility] for tasks assessed/performed       History reviewed. No pertinent past medical history.  Past Surgical History:  Procedure Laterality Date  . BREAST BIOPSY Right    neg  . TUBAL LIGATION Bilateral 08/29/2017   Procedure: POST PARTUM TUBAL LIGATION;  Surgeon: Ward, Honor Loh, MD;  Location: [facility] ORS;  Service: Gynecology;  Laterality: Bilateral;    There were no vitals filed for this visit.    Pelvic Floor Physical Therapy Treatment Note  SCREENING  Changes in medications, allergies, or medical history?: No     SUBJECTIVE  Patient reports: Patient has been overwhelmed at work. Has only been able to workout 1x/ week. No change to UUI symptoms. Reports diaphragmatic breathing does not come naturally, does help her relax though.   Precautions:  N/A at this time.   Pain update:  Location of pain: low back pain only when bent forward.   Current pain:  NA/10  Max pain:  NA/10 Least pain:  NA/10 Nature of pain: deep ache   Patient Goals: Be able to close the DR gap, and strengthen the back.    OBJECTIVE  Changes in: Posture/Observations:  Anterior pelvic tilt   Range of Motion/Flexibilty:  N/A  Strength/MMT:  LE MMT: N/A   Abdominal:  LLQ fascial tension   Palpation: TTP L [place]  Gait Analysis: anterior pelvic tilt;  --running analysis: heel strike; small valgus collapse; anterior pelvic tilt preference   INTERVENTIONS THIS SESSION: Manual: TP release at L [place] followed by fascia assistance (with diaphragmatic breathing) for abdominal closing to improve pelvic alignment and improve abdominal activation at rest. (10 minutes)   ThereAct: Practiced diaphragamtic breathing- with pelvic tilts in hook-lying and in sitting for improved proprioception of pelvic neutral. Patient benefited from visual cues of pelvic "water bucket". (15 minutes)   Gait Training: Performed running analysis (important to patient's QOL); limits of stability step training followed by sequential (mirroring) running exercise. Video taken on patients phone for HEP. Running mechanics breakdown and training performed to decrease impact on heels, and distribute forces away from the pelvic floor muscles in order to reduce symptoms, decrease risk of injury. (30 minutes)   Total time: 55 minutes  PT Short Term Goals - 11/14/18 1029      PT SHORT TERM GOAL #1   Title  Patient will demonstrate a coordinated contraction, relaxation, and bulge of the pelvic floor muscles to demonstrate functional recruitment and motion and allow for further strengthening.    Baseline  Pt demonstrates accessory muscle breathing in shoulders/chest; decreased rib mobility. DR (1 finger seperation at rest).    Time  5    Period  Weeks    Status  New    Target Date  12/19/18      PT SHORT TERM GOAL #2    Title  Patient will demonstrate a coordinated contraction, relaxation, and bulge of the pelvic floor muscles to demonstrate functional recruitment and motion and allow for further strengthening.    Baseline  Pt demostrates increased intraabdominal pressure with functional activites.    Time  5    Period  Weeks    Status  New    Target Date  12/19/18      PT SHORT TERM GOAL #3   Title  Patient will demonstrate appropriate body mechanics with bending and lifting heavy objects to allow for decreased stress on the pelvic floor and low back.    Baseline  Pt has occasional pain while lifting children (38 year old especially).    Time  5    Period  Weeks    Status  New    Target Date  12/19/18        PT Long Term Goals - 11/14/18 1031      PT LONG TERM GOAL #1   Title  Patient will be independent with HEP and stress-management routines in order to return to PLOF.    Baseline  Pt will benenfit from knowledge of HEP that can be progress/regressed to match goals and not increase DR.    Time  10    Period  Weeks    Status  New    Target Date  01/23/19      PT LONG TERM GOAL #2   Title  Pt will demo decreased abdominal separation from 1 fingers width to < 1 fingers width in order to progress to higher functional exercise routines w/ decreased risk for injuries.    Baseline  1 finger seperation at rest; closes with crunch; no TA activation with SLR.    Time  10    Period  Weeks    Status  New    Target Date  01/23/19            Plan - 11/21/18 1008    Clinical Impression Statement  Pt. Responded well to all interventions today, demonstrating improved proprioception of pelvic neutral, improved running performance (decreased heel strike),  as well as understanding and correct performance of all education and exercises provided today. They will continue to benefit from skilled physical therapy to work toward remaining goals and maximize function as well as decrease likelihood of symptom  increase or recurrence.    Personal Factors and Comorbidities  Time since onset of injury/illness/exacerbation    Examination-Activity Limitations  Carry;Transfers    Examination-Participation Restrictions  Community Activity    Stability/Clinical Decision Making  Stable/Uncomplicated    Rehab Potential  Good    PT Frequency  1x / week    PT Duration  Other (comment)   10 weeks   PT Treatment/Interventions  ADLs/Self Care Home Management;Biofeedback;Electrical Stimulation;Moist Heat;Gait training;Stair training;Functional mobility training;Therapeutic activities;Therapeutic exercise;Balance training;Neuromuscular re-education;Patient/family education;Manual techniques;Energy conservation;Joint Manipulations;Spinal Manipulations  PT Next Visit Plan  reassess pelvic obliquities and possible LLD; reassess running mechanics; multidirectional stepping    PT Home Exercise Plan  diaphragmatic breathing, optimizing supine<>sit, sit<>stand; body mechanics handout; running mechanics, hooklying and sitting pelvic tilts.    Consulted and Agree with Plan of Care  Patient       Patient will benefit from skilled therapeutic intervention in order to improve the following deficits and impairments:  Abnormal gait, Decreased coordination, Decreased balance, Decreased range of motion, Decreased strength, Impaired flexibility, Improper body mechanics, Postural dysfunction, Pain, Increased muscle spasms  Visit Diagnosis: Chronic bilateral low back pain, unspecified whether sciatica present  Abnormal posture  Diastasis recti     Problem List Patient Active Problem List   Diagnosis Date Noted  . Uterine contractions during pregnancy 08/28/2017  . Labor and delivery indication for care or intervention 08/28/2017    Gilford RileRebba Glynna Failla, SPT 11/21/2018, 11:44 AM  [place] [facility] [address] [place], [place], [address] Phone: [phone]   Fax:   [phone]  Name: Chelsea Hoffman MRN: [id number] Date of Birth: Jan 06, 1981

## 2018-11-28 ENCOUNTER — Ambulatory Visit: Payer: PRIVATE HEALTH INSURANCE | Attending: Certified Nurse Midwife

## 2018-11-28 ENCOUNTER — Other Ambulatory Visit: Payer: Self-pay

## 2018-11-28 DIAGNOSIS — M6208 Separation of muscle (nontraumatic), other site: Secondary | ICD-10-CM | POA: Diagnosis present

## 2018-11-28 DIAGNOSIS — M545 Low back pain: Secondary | ICD-10-CM | POA: Insufficient documentation

## 2018-11-28 DIAGNOSIS — G8929 Other chronic pain: Secondary | ICD-10-CM | POA: Insufficient documentation

## 2018-11-28 DIAGNOSIS — R293 Abnormal posture: Secondary | ICD-10-CM | POA: Insufficient documentation

## 2018-11-28 NOTE — Patient Instructions (Addendum)
Deep Core Exercises: Repeat 3 times. (one rotisserie chicken)  1. Single Leg Bridges (1 set of 10 each leg- for one round)  - cues: breath out as you go up- start a little before you start movement  As you go down move one body part a time a time- like a wave.    2. Side plank  30 seconds on each side/ round  Cue "make a dimple with your shoulder"  Breath out as you go up.    3. Forward plank (front)  - bring upper back towards the ceiling  30 seconds/ round  -- if wrists hurt- make a fist or go to your forearms.    4. Side Plank other side.    5. Boat pose  Bring your hips bones towards themselves. Only go as far back as wear the weakness starts at your belly.  -- keeping track of how far back you can go without separations.  -- stop if you feel a gap in your belly or if you start to arch your back.  -- go for about 30 seconds or until compensations occur.    Repeat back to exercise 1. Exercises 1-5 is "one rotisserie chicken"    Continue the following exercises: diaphragmatic breathing (throuhgout the day in a seated position), running mechanics, continue getting in and out of bed and from up and down from a seated position with good mechanics - breath out during the effort part.

## 2018-11-28 NOTE — Therapy (Signed)
[place] [facility] [address] [place], [place], [address] Phone: [phone]   Fax:  [phone]  Physical Therapy Treatment The patient has been informed of current processes in place at Outpatient Rehab to protect patients from Covid-19 exposure including social distancing, schedule modifications, and new cleaning procedures. After discussing their particular risk with a therapist based on the patient's personal risk factors, the patient has decided to proceed with in-person therapy.  Patient Details  Name: Chelsea Hoffman MRN: [id number] Date of Birth: May 01, 1980 No data recorded  Encounter Date: 11/28/2018  PT End of Session - 11/28/18 0959    Visit Number  3    Number of Visits  10    Date for PT Re-Evaluation  01/23/19    Authorization - Visit Number  3    Authorization - Number of Visits  10    PT Start Time  0910    PT Stop Time  1000    PT Time Calculation (min)  50 min    Activity Tolerance  Patient tolerated treatment well;No increased pain    Behavior During Therapy  [facility] for tasks assessed/performed       No past medical history on file.  Past Surgical History:  Procedure Laterality Date  . BREAST BIOPSY Right    neg  . TUBAL LIGATION Bilateral 08/29/2017   Procedure: POST PARTUM TUBAL LIGATION;  Surgeon: Ward, Elenora Fenderhelsea C, MD;  Location: [facility] ORS;  Service: Gynecology;  Laterality: Bilateral;    There were no vitals filed for this visit.    Pelvic Floor Physical Therapy Treatment Note  SCREENING  Changes in medications, allergies, or medical history?: No     SUBJECTIVE  Patient reports: Patient has been overwhelmed at work. Has only been able to workout (walk with weights) 1x/ week. Stress level is at a 6-7/10 on NPS. Has not been able to be compliant with HEP this week due to stress level. Went to a deep tissue massage last week and no longer has carpal tunnel symptoms.   Precautions:  N/A at this  time.   Pain update:  Location of pain: low back pain only when bent forward.  Current pain:  NA/10  Max pain:  NA/10 Least pain:  NA/10 Nature of pain: deep ache   Patient Goals: Be able to close the DR gap, and strengthen the back.    OBJECTIVE  Changes in: Posture/Observations:  Anterior pelvic tilt - reduced in standing position   Range of Motion/Flexibilty:  N/A  Strength/MMT:  LE MMT: N/A   Abdominal:  LLQ fascial tension *Diastasis: 1 finger separation just above the umbilicu B with SLR; paradoxical cough.   Palpation: Not assessed this session   Gait Analysis: anterior pelvic tilt;  --running analysis: heel strike; small valgus collapse; anterior pelvic tilt preference   INTERVENTIONS THIS SESSION:  Therex: Dynamic multidirectional stepping for improved pelvic alignment with TA engagement coordination. Balancing on R greater difficulty compared to L. Sequencing cues for activating anticipatory postural muscles I.e. deep core. SLS bridges with cues for posterior pelvic tilt and eccentric control. B side planks with cues for UE positioning, forward planks progressed to on heels with arms extended- cues for mid back positioning. Modified boat pose for closure of DR- cues for self tactile feedback to stop exercise if DR increases or pelvic compensations occur. (Therex performed today given as HEP). (50 minutes)   Total time: 50 minutes  PT Short Term Goals - 11/14/18 1029      PT SHORT TERM GOAL #1   Title  Patient will demonstrate a coordinated contraction, relaxation, and bulge of the pelvic floor muscles to demonstrate functional recruitment and motion and allow for further strengthening.    Baseline  Pt demonstrates accessory muscle breathing in shoulders/chest; decreased rib mobility. DR (1 finger seperation at rest).    Time  5    Period  Weeks    Status  New    Target Date  12/19/18      PT SHORT TERM GOAL #2   Title  Patient will  demonstrate a coordinated contraction, relaxation, and bulge of the pelvic floor muscles to demonstrate functional recruitment and motion and allow for further strengthening.    Baseline  Pt demostrates increased intraabdominal pressure with functional activites.    Time  5    Period  Weeks    Status  New    Target Date  12/19/18      PT SHORT TERM GOAL #3   Title  Patient will demonstrate appropriate body mechanics with bending and lifting heavy objects to allow for decreased stress on the pelvic floor and low back.    Baseline  Pt has occasional pain while lifting children (61 year old especially).    Time  5    Period  Weeks    Status  New    Target Date  12/19/18        PT Long Term Goals - 11/14/18 1031      PT LONG TERM GOAL #1   Title  Patient will be independent with HEP and stress-management routines in order to return to PLOF.    Baseline  Pt will benenfit from knowledge of HEP that can be progress/regressed to match goals and not increase DR.    Time  10    Period  Weeks    Status  New    Target Date  01/23/19      PT LONG TERM GOAL #2   Title  Pt will demo decreased abdominal separation from 1 fingers width to < 1 fingers width in order to progress to higher functional exercise routines w/ decreased risk for injuries.    Baseline  1 finger seperation at rest; closes with crunch; no TA activation with SLR.    Time  10    Period  Weeks    Status  New    Target Date  01/23/19            Plan - 11/28/18 1000    Clinical Impression Statement  Pt. Responded well to all interventions today, demonstrating improved anticipatory postural reactions with dynamic balance, increased TA engagement, as well as understanding and correct performance of all education and exercises provided today. They will continue to benefit from skilled physical therapy to work toward remaining goals and maximize function as well as decrease likelihood of symptom increase or recurrence.     Personal Factors and Comorbidities  Time since onset of injury/illness/exacerbation    Examination-Activity Limitations  Carry;Transfers    Examination-Participation Restrictions  Community Activity    Stability/Clinical Decision Making  Stable/Uncomplicated    Rehab Potential  Good    PT Frequency  1x / week    PT Duration  Other (comment)   10 weeks   PT Treatment/Interventions  ADLs/Self Care Home Management;Biofeedback;Electrical Stimulation;Moist Heat;Gait training;Stair training;Functional mobility training;Therapeutic activities;Therapeutic exercise;Balance training;Neuromuscular re-education;Patient/family education;Manual techniques;Energy conservation;Joint Manipulations;Spinal Manipulations    PT  Next Visit Plan  reassess pelvic obliquities and possible LLD; reassess running mechanics; multidirectional stepping (SLS); standing/wall mountain climbers (running applications)    PT Home Exercise Plan  diaphragmatic breathing, optimizing supine<>sit, sit<>stand; body mechanics handout; running mechanics, SL bridges, boat pose, side planks and fwd plank    Consulted and Agree with Plan of Care  Patient       Patient will benefit from skilled therapeutic intervention in order to improve the following deficits and impairments:  Abnormal gait, Decreased coordination, Decreased balance, Decreased range of motion, Decreased strength, Impaired flexibility, Improper body mechanics, Postural dysfunction, Pain, Increased muscle spasms  Visit Diagnosis: Chronic bilateral low back pain, unspecified whether sciatica present  Abnormal posture  Diastasis recti     Problem List Patient Active Problem List   Diagnosis Date Noted  . Uterine contractions during pregnancy 08/28/2017  . Labor and delivery indication for care or intervention 08/28/2017   Chelsea Hoffman, SPT  Cleophus Molt 11/28/2018, 4:22 PM  [place] [facility] MAIN [facility] SERVICES [address]  [place], [place], [address] Phone: [phone]   Fax:  [phone]  Name: Chelsea Hoffman MRN: [id number] Date of Birth: [birth year]

## 2018-12-05 ENCOUNTER — Ambulatory Visit: Payer: PRIVATE HEALTH INSURANCE

## 2018-12-12 ENCOUNTER — Other Ambulatory Visit: Payer: Self-pay

## 2018-12-12 ENCOUNTER — Ambulatory Visit: Payer: PRIVATE HEALTH INSURANCE

## 2018-12-12 DIAGNOSIS — M545 Low back pain: Secondary | ICD-10-CM | POA: Diagnosis not present

## 2018-12-12 DIAGNOSIS — M6208 Separation of muscle (nontraumatic), other site: Secondary | ICD-10-CM

## 2018-12-12 DIAGNOSIS — R293 Abnormal posture: Secondary | ICD-10-CM

## 2018-12-12 DIAGNOSIS — G8929 Other chronic pain: Secondary | ICD-10-CM

## 2018-12-12 NOTE — Therapy (Signed)
[facility] MAIN [facility] SERVICES [address] [place], [place], [address] Phone: [phone]   Fax:  [phone]  Physical Therapy Treatment The patient has been informed of current processes in place at Outpatient Rehab to protect patients from Covid-19 exposure including social distancing, schedule modifications, and new cleaning procedures. After discussing their particular risk with a therapist based on the patient's personal risk factors, the patient has decided to proceed with in-person therapy.  Patient Details  Name: Chelsea Hoffman MRN: [id number] Date of Birth: 1980-11-01 No data recorded  Encounter Date: 12/12/2018  PT End of Session - 12/12/18 1003    Visit Number  4    Number of Visits  10    Date for PT Re-Evaluation  01/23/19    Authorization - Visit Number  4    Authorization - Number of Visits  10    PT Start Time  0902    PT Stop Time  1002    PT Time Calculation (min)  60 min    Activity Tolerance  Patient tolerated treatment well;No increased pain    Behavior During Therapy  [facility] for tasks assessed/performed       No past medical history on file.  Past Surgical History:  Procedure Laterality Date  . BREAST BIOPSY Right    neg  . TUBAL LIGATION Bilateral 08/29/2017   Procedure: POST PARTUM TUBAL LIGATION;  Surgeon: Ward, Elenora Fender, MD;  Location: [facility] ORS;  Service: Gynecology;  Laterality: Bilateral;    There were no vitals filed for this visit.     Pelvic Floor Physical Therapy Treatment Note  SCREENING  Changes in medications, allergies, or medical history?: No     SUBJECTIVE  Patient reports: Has been able to perform all her exercises 4x since last week. Stress level is less compared to last week. Has been able to walk with weights or jog 2x since last week. Shares sometimes she avoids running or jumping because she occasionally has a deep ache in both knees and she would like to avoid surgery in the  future. States it's "nothing debilitating" . Patient also reports that to avoid any occ'l urinary leakage with running or jumping she has to "hold in her abs"- this she reports does not inhibit her breathing, but is fatiguing.   Precautions:  N/A at this time.   Pain update:  Location of pain: low back pain only when bent forward.  Current pain:  NA/10  Max pain:  NA/10 Least pain:  NA/10 Nature of pain: deep ache   Patient Goals: Be able to close the DR gap, and strengthen the back.    OBJECTIVE  Changes in: Posture/Observations:  Anterior pelvic tilt - reduced in standing position   Range of Motion/Flexibilty:  N/A  Strength/MMT:  LE MMT: N/A   Abdominal:  LLQ fascial tension *Diastasis: 1 finger separation to closure with crunch.   Palpation: TTP to glute med/max; piriformis on R;   Gait Analysis: anterior pelvic tilt;  --running analysis: heel strike; small valgus collapse; anterior pelvic tilt preference   Squat analysis: with fatigue, R LE shifts into ER.   INTERVENTIONS THIS SESSION: Manual: TP release to R glute med/max; R piriformis; manual anterior innominate rotation correction- 90% improvement following manual therapy. (20 minutes)   Therex: B clam shells with red theraband x10 with 1-2 second holds.  Mountain climbers (4 reps then repeat).  Seated piriformis stretch B (R>L); supine hamstring stretch with hip ADD on R. Squat  progression with lifting/carrying education to reduce low back pain while carrying heavy loads. Initiating jumping mechanics: squat to heel raise, preference for R LE to shift into ER to improve endurance of PFM and deep core muscles. (40 minutes)   **send mindfullness next week (get email)    Total time: 60 minutes         PT Short Term Goals - 11/14/18 1029      PT SHORT TERM GOAL #1   Title  Patient will demonstrate a coordinated contraction, relaxation, and bulge of the pelvic floor muscles to demonstrate functional  recruitment and motion and allow for further strengthening.    Baseline  Pt demonstrates accessory muscle breathing in shoulders/chest; decreased rib mobility. DR (1 finger seperation at rest).    Time  5    Period  Weeks    Status  New    Target Date  12/19/18      PT SHORT TERM GOAL #2   Title  Patient will demonstrate a coordinated contraction, relaxation, and bulge of the pelvic floor muscles to demonstrate functional recruitment and motion and allow for further strengthening.    Baseline  Pt demostrates increased intraabdominal pressure with functional activites.    Time  5    Period  Weeks    Status  New    Target Date  12/19/18      PT SHORT TERM GOAL #3   Title  Patient will demonstrate appropriate body mechanics with bending and lifting heavy objects to allow for decreased stress on the pelvic floor and low back.    Baseline  Pt has occasional pain while lifting children (41 year old especially).    Time  5    Period  Weeks    Status  New    Target Date  12/19/18        PT Long Term Goals - 11/14/18 1031      PT LONG TERM GOAL #1   Title  Patient will be independent with HEP and stress-management routines in order to return to PLOF.    Baseline  Pt will benenfit from knowledge of HEP that can be progress/regressed to match goals and not increase DR.    Time  10    Period  Weeks    Status  New    Target Date  01/23/19      PT LONG TERM GOAL #2   Title  Pt will demo decreased abdominal separation from 1 fingers width to < 1 fingers width in order to progress to higher functional exercise routines w/ decreased risk for injuries.    Baseline  1 finger seperation at rest; closes with crunch; no TA activation with SLR.    Time  10    Period  Weeks    Status  New    Target Date  01/23/19            Plan - 12/12/18 1004    Clinical Impression Statement  Pt. Responded well to all interventions today, demostrating improved muscle activation with functional movements  (squat/lfit), improved pain management/treatment techniques after sitting,  as well as understanding and correct performance of all education and exercises provided today. They will continue to benefit from skilled physical therapy to work toward remaining goals and maximize function as well as decrease likelihood of symptom increase or recurrence.    Personal Factors and Comorbidities  Time since onset of injury/illness/exacerbation    Examination-Activity Limitations  Carry;Transfers    Examination-Participation Restrictions  Community Activity    Stability/Clinical Decision Making  Stable/Uncomplicated    Rehab Potential  Good    PT Frequency  1x / week    PT Duration  Other (comment)    PT Treatment/Interventions  ADLs/Self Care Home Management;Biofeedback;Electrical Stimulation;Moist Heat;Gait training;Stair training;Functional mobility training;Therapeutic activities;Therapeutic exercise;Balance training;Neuromuscular re-education;Patient/family education;Manual techniques;Energy conservation;Joint Manipulations;Spinal Manipulations    PT Next Visit Plan  *get email to send mindfullness* check for possible LLD; reassess running mechanics and multidirectional stepping (SLS); add mountain climbers for 1:30 instead of forward plank    PT Home Exercise Plan  diaphragmatic breathing, optimizing supine<>sit, sit<>stand; body mechanics handout; running mechanics, SL bridges, boat pose, side planks and fwd plank; clam shells with red theraband; piriformis stretch    Consulted and Agree with Plan of Care  Patient       Patient will benefit from skilled therapeutic intervention in order to improve the following deficits and impairments:  Abnormal gait, Decreased coordination, Decreased balance, Decreased range of motion, Decreased strength, Impaired flexibility, Improper body mechanics, Postural dysfunction, Pain, Increased muscle spasms  Visit Diagnosis: Chronic bilateral low back pain, unspecified  whether sciatica present  Abnormal posture  Diastasis recti     Problem List Patient Active Problem List   Diagnosis Date Noted  . Uterine contractions during pregnancy 08/28/2017  . Labor and delivery indication for care or intervention 08/28/2017   Regan Llorente Blair HaileyMaylone, SPT  Nataya Bastedo 12/12/2018, 10:17 AM  [place] [facility] [address] [place], [place], [id number] Phone: [phone]   Fax:  [phone]  Name: Wendee CoppLesley A Kosek MRN: [id number] Date of Birth: March 17, 1980

## 2018-12-12 NOTE — Patient Instructions (Addendum)
180 bpm  - looking on [employer]  - best for running [employer]  2 sets on 10 with 1-2 second hold.      Seated Piriformis Stretch  Perform 2 sets of 10 belly breaths   ** check in when you have tailbone pain (I.e. how were you sitting?)   If the seated piriformis stretch does not relieve pain try doing this laying down hamstring stretch  Then cross the moving leg across your chest for a deeper stretch.  Hold for 2 sets of 10 belly breaths.

## 2018-12-26 ENCOUNTER — Other Ambulatory Visit: Payer: Self-pay

## 2018-12-26 ENCOUNTER — Ambulatory Visit: Payer: PRIVATE HEALTH INSURANCE | Attending: Certified Nurse Midwife

## 2018-12-26 DIAGNOSIS — R293 Abnormal posture: Secondary | ICD-10-CM | POA: Diagnosis present

## 2018-12-26 DIAGNOSIS — M545 Low back pain: Secondary | ICD-10-CM | POA: Insufficient documentation

## 2018-12-26 DIAGNOSIS — M6208 Separation of muscle (nontraumatic), other site: Secondary | ICD-10-CM | POA: Diagnosis present

## 2018-12-26 DIAGNOSIS — G8929 Other chronic pain: Secondary | ICD-10-CM | POA: Diagnosis present

## 2018-12-26 NOTE — Therapy (Signed)
[place] MAIN [facility] SERVICES [address] [place], [place], [address] Phone: [phone]   Fax:  [phone]  Physical Therapy Treatment and D/C summary  The patient has been informed of current processes in place at Outpatient Rehab to protect patients from Covid-19 exposure including social distancing, schedule modifications, and new cleaning procedures. After discussing their particular risk with a therapist based on the patient's personal risk factors, the patient has decided to proceed with in-person therapy.  Patient Details  Name: Chelsea Hoffman MRN: [id number] Date of Birth: 03-26-1980 No data recorded  Encounter Date: 12/26/2018  PT End of Session - 12/26/18 1024    Visit Number  5    Number of Visits  10    Date for PT Re-Evaluation  01/23/19    Authorization - Visit Number  5    Authorization - Number of Visits  10    PT Start Time  0904    PT Stop Time  1000    PT Time Calculation (min)  56 min    Activity Tolerance  Patient tolerated treatment well;No increased pain    Behavior During Therapy  [facility] for tasks assessed/performed       No past medical history on file.  Past Surgical History:  Procedure Laterality Date  . BREAST BIOPSY Right    neg  . TUBAL LIGATION Bilateral 08/29/2017   Procedure: POST PARTUM TUBAL LIGATION;  Surgeon: Ward, Honor Loh, MD;  Location: [facility] ORS;  Service: Gynecology;  Laterality: Bilateral;    There were no vitals filed for this visit.   Pelvic Floor Physical Therapy Treatment Note and Discharge Summary  SCREENING  Changes in medications, allergies, or medical history?: No     SUBJECTIVE  Patient reports: Feels like she is getting stronger, running a little intermittently as she is walking on the treadmill 5 run/15 walk. Sore from exercise but no pain. Is very focused on whether she is engaging in the right area with exercise and with sitting. Her step stool is too big, needs a smaller  one.  Precautions:  N/A at this time.   Pain update:  Location of pain: low back pain only when bent forward.  Current pain:  0/10  Max pain:  0/10 Least pain:  0/10 Nature of pain: deep ache   Patient Goals: Be able to close the DR gap, and strengthen the back.    OBJECTIVE  Changes in: Posture/Observations:  Balanced sitting and standing posture, self-correcting when out of neutral without cueing  Range of Motion/Flexibilty:  N/A  Strength/MMT:  LE MMT: N/A   Abdominal:  Able to perform modified boat pose to ~120 degrees while maintaining deep-core activation, self-limiting motion appropriately without cue.  Palpation:   Gait Analysis: Pt. Reports able to incorporate running into her walking w/o increased pain or leakage.  Squat analysis: demonstrates appropriate performance  INTERVENTIONS THIS SESSION: Self-care: reviewed mindfulness and how it looks for her, emphasizing intentionally re-directing her thoughts to her "relaxing place" whenever they try to wander and giving permission to do this during an activity like walking, not just in the bath tub. Educated on how long to give the body to finish healing before returning to feeling like she can do exercise without emphasizing as much deep-core, when it should be muscle-memory.   Therex: reviewed modified boat pose, leg-lowers, dead-bug, heel-taps, planks with hip EXT, lateral heel-tap crunches to review where limitation should be to prevent return of Sx, how and when to  progress. Educated on and practiced thoracic mobility exercise to allow for maintenance of improved upper posture and decreased pain. Educated on using hands under the tailbone to improve mechanics with supine leg lift type exercises and reviewed the importance of using exhale on exertion even with upper body exercise/ minimizing lumbar extension.   Total time: 60 minutes                             PT Short Term Goals -  12/26/18 0936      PT SHORT TERM GOAL #1   Title  Patient will demonstrate a coordinated contraction, relaxation, and bulge of the pelvic floor muscles to demonstrate functional recruitment and motion and allow for further strengthening.    Baseline  Pt demonstrates accessory muscle breathing in shoulders/chest; decreased rib mobility. DR (1 finger seperation at rest).    Time  5    Period  Weeks    Status  Achieved    Target Date  12/19/18      PT SHORT TERM GOAL #2   Title  Patient will demonstrate a coordinated contraction, relaxation, and bulge of the pelvic floor muscles to demonstrate functional recruitment and motion and allow for further strengthening.    Baseline  Pt demostrates increased intraabdominal pressure with functional activites.    Time  5    Period  Weeks    Status  Achieved    Target Date  12/19/18      PT SHORT TERM GOAL #3   Title  Patient will demonstrate appropriate body mechanics with bending and lifting heavy objects to allow for decreased stress on the pelvic floor and low back.    Baseline  Pt has occasional pain while lifting children (64 year old especially).    Time  5    Period  Weeks    Status  Achieved    Target Date  12/19/18        PT Long Term Goals - 12/26/18 5035      PT LONG TERM GOAL #1   Title  Patient will be independent with HEP and stress-management routines in order to return to PLOF.    Baseline  Pt will benenfit from knowledge of HEP that can be progress/regressed to match goals and not increase DR.    Time  10    Period  Weeks    Status  Achieved    Target Date  01/23/19      PT LONG TERM GOAL #2   Title  Pt will demo decreased abdominal separation from 1 fingers width to < 1 fingers width in order to progress to higher functional exercise routines w/ decreased risk for injuries.    Baseline  1 finger seperation at rest; closes with crunch; no TA activation with SLR.    Time  10    Period  Weeks    Status  Achieved     Target Date  01/23/19            Plan - 12/26/18 1025    Clinical Impression Statement  Pt. has met all goals and demonstrates understanding of how to progress her exercises safely at home to continue to demonstrate further deep-core strength and maintain decreased DRA safely. She has not had any pain for weeks and agrees with plan to D/C at this time to HEP.    Personal Factors and Comorbidities  Time since onset of injury/illness/exacerbation    Examination-Activity  Limitations  Carry;Transfers    Examination-Participation Restrictions  Community Activity    Stability/Clinical Decision Making  Stable/Uncomplicated    Clinical Decision Making  Low    Rehab Potential  Good    PT Frequency  1x / week    PT Duration  Other (comment)    PT Treatment/Interventions  ADLs/Self Care Home Management;Biofeedback;Electrical Stimulation;Moist Heat;Gait training;Stair training;Functional mobility training;Therapeutic activities;Therapeutic exercise;Balance training;Neuromuscular re-education;Patient/family education;Manual techniques;Energy conservation;Joint Manipulations;Spinal Manipulations    PT Next Visit Plan  D/C    PT Home Exercise Plan  diaphragmatic breathing, optimizing supine<>sit, sit<>stand; body mechanics handout; running mechanics, SL bridges, boat pose, side planks and fwd plank; clam shells with red theraband; piriformis stretch    Consulted and Agree with Plan of Care  Patient       Patient will benefit from skilled therapeutic intervention in order to improve the following deficits and impairments:  Abnormal gait, Decreased coordination, Decreased balance, Decreased range of motion, Decreased strength, Impaired flexibility, Improper body mechanics, Postural dysfunction, Pain, Increased muscle spasms  Visit Diagnosis: Chronic bilateral low back pain, unspecified whether sciatica present  Abnormal posture  Diastasis recti     Problem List Patient Active Problem List    Diagnosis Date Noted  . Uterine contractions during pregnancy 08/28/2017  . Labor and delivery indication for care or intervention 08/28/2017   Willa Rough DPT, ATC Willa Rough 12/26/2018, 10:39 AM  [place] [address] [place], [place], [address] Phone: [phone]   Fax:  [phone]  Name: NAKIA REMMERS MRN: [id number] Date of Birth: 10-06-1980

## 2018-12-26 NOTE — Patient Instructions (Signed)
Exhale on Exertion even with upper body exercise  Use hands under tailbone to do leg lifts/lowers and make sure you pay attention to your fatigue level to determine how far to go.  Make sure to take ~ 30 min. At least 3 days a week of "mindfulness" time paying attention to wandering thoughts and bringing your focus back to whatever calms you. Do this actively or passively (e.g walking or resting) as feels best.   When your daughter is 2 and 1/2 you can start to be less cautious with your exercise but always remember to use your deep core.     Place foam roller or towel under your upper back between your shoulder blades. Support your head with your hands, elbows forward, and gently rock back and forth and side to side to improve motion in your back.   Move the foam roller or towel up and down to a few spots in the upper back, repeating the process.     Sit at the end of the foam roller or towel roll and lie back with your spine along the length of it. Bring your arms out to the side, palms up, and take ___ belly breaths.  Next, slide arms up overhead doing a "snow angel" motion as you breathe in and down toward your hips as you breathe out. Do this for ___ belly breaths.     Support your head with your hands, elbows forward, bridge the hips up and breathe deeply as you roll up and down the top-half of the back relaxing into extension to mobilize the spine.

## 2020-08-18 ENCOUNTER — Other Ambulatory Visit: Payer: Self-pay | Admitting: Student

## 2020-08-18 DIAGNOSIS — Z1231 Encounter for screening mammogram for malignant neoplasm of breast: Secondary | ICD-10-CM

## 2020-09-29 ENCOUNTER — Ambulatory Visit
Admission: RE | Admit: 2020-09-29 | Discharge: 2020-09-29 | Disposition: A | Discharge status: Home / Self Care | Payer: No Typology Code available for payment source | Source: Ambulatory Visit | Attending: Student | Admitting: Student

## 2020-09-29 ENCOUNTER — Other Ambulatory Visit: Payer: Self-pay

## 2020-09-29 DIAGNOSIS — Z1231 Encounter for screening mammogram for malignant neoplasm of breast: Secondary | ICD-10-CM | POA: Insufficient documentation

## 2020-09-29 IMAGING — MG MM DIGITAL SCREENING BILAT W/ TOMO AND CAD
6 of 10 series · 6 of 30 positions shown · non-contrast
Comparison: Previous exam(s).

CLINICAL DATA: Screening. RIGHT excisional biopsy. Strong family
history.

EXAM:
DIGITAL SCREENING BILATERAL MAMMOGRAM WITH TOMOSYNTHESIS AND CAD
TECHNIQUE: Bilateral screening digital craniocaudal and mediolateral oblique
mammograms were obtained. Bilateral screening digital breast
tomosynthesis was performed. The images were evaluated with
computer-aided detection.

[L MLO synth-2D]
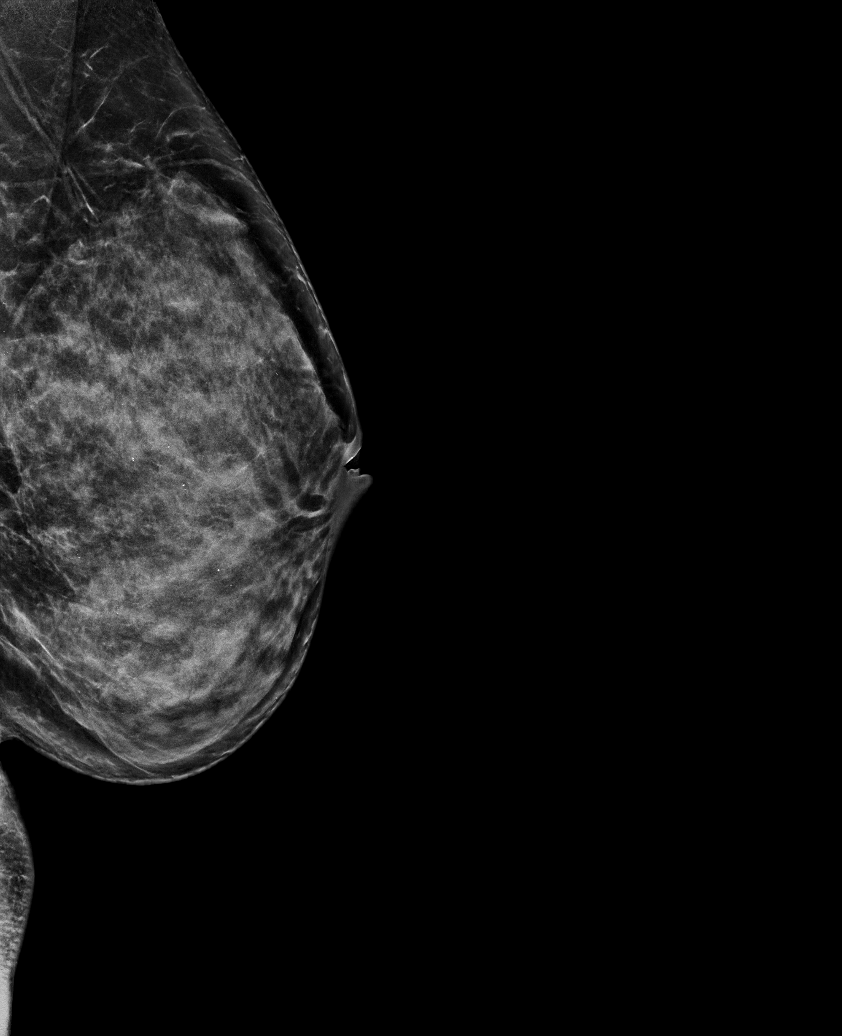

[R CC synth-2D]
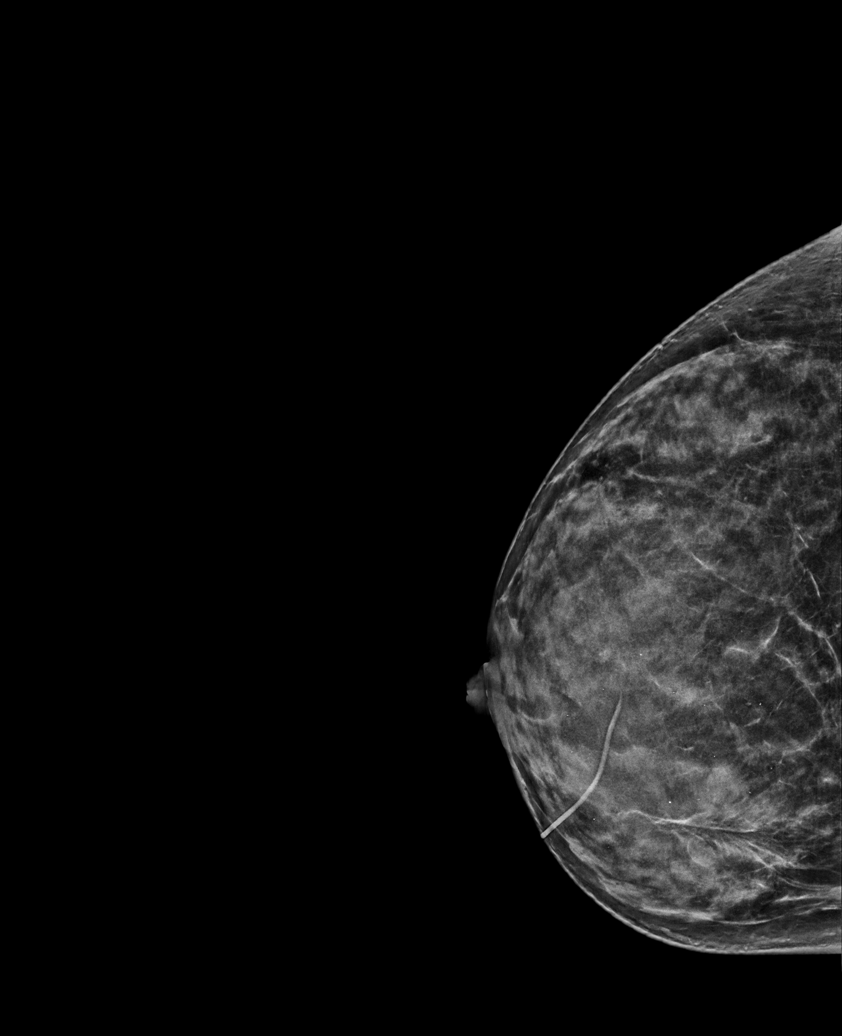

[R MLO synth-2D (1 of 2)]
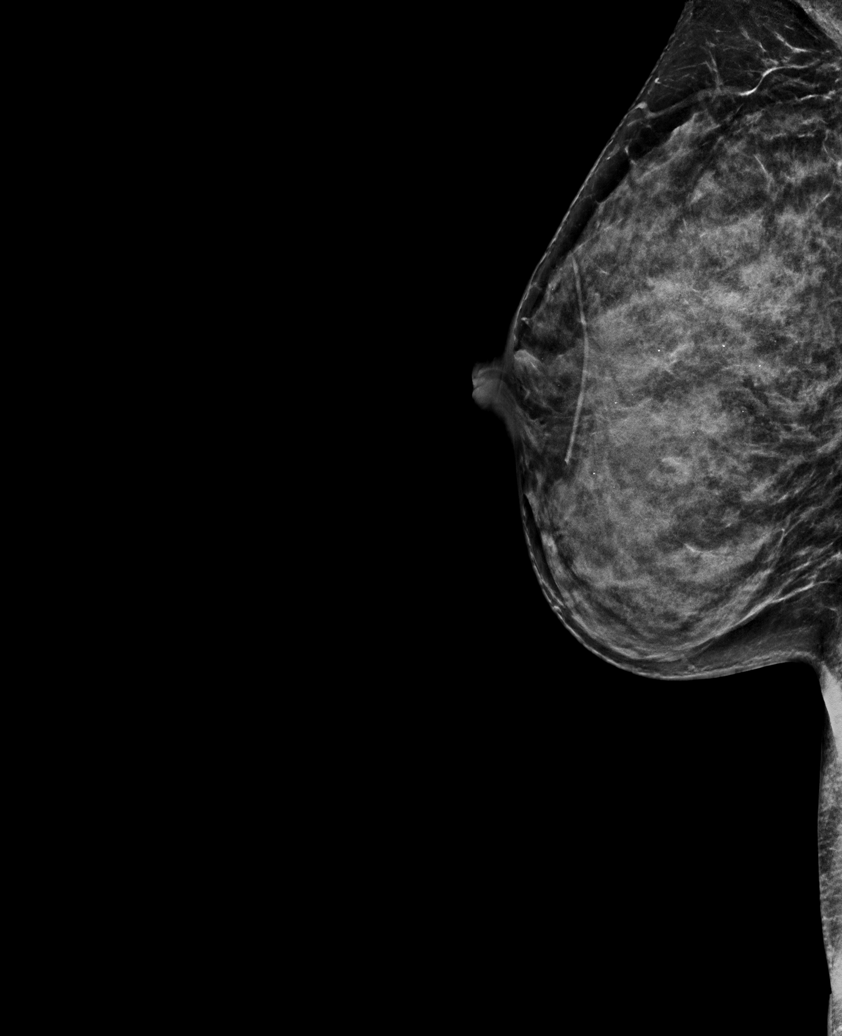

[R MLO synth-2D (2 of 2)]
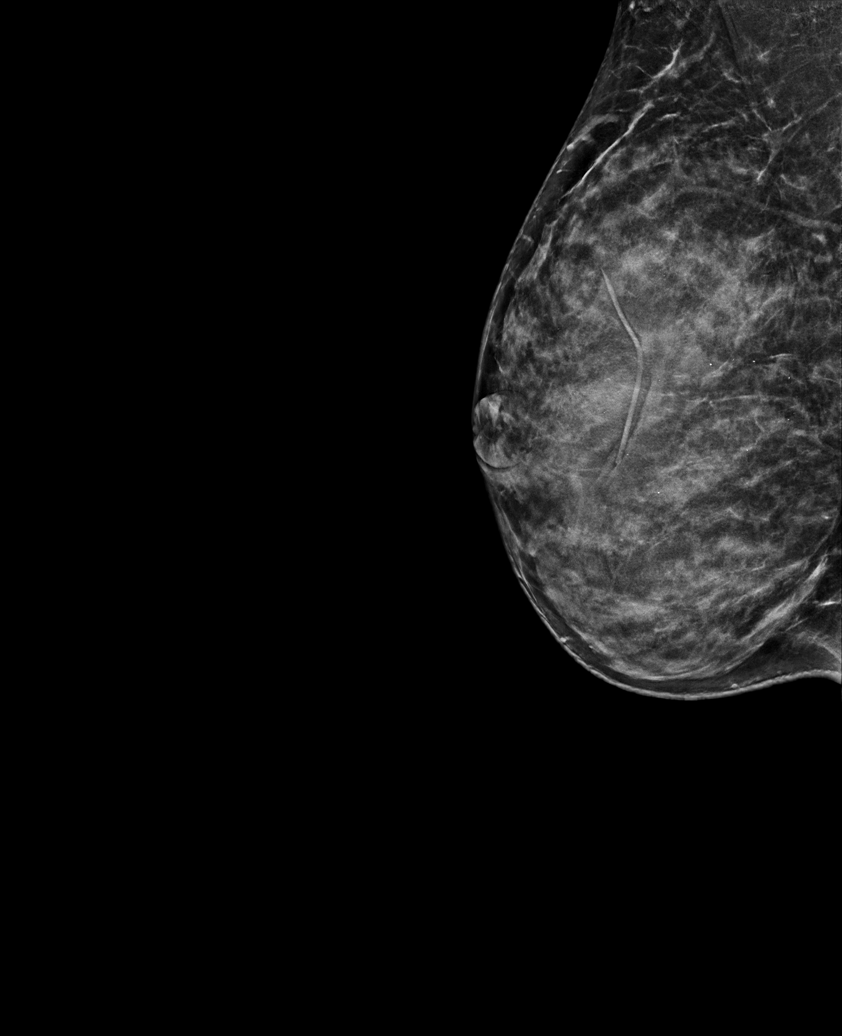

[L CC synth-2D]
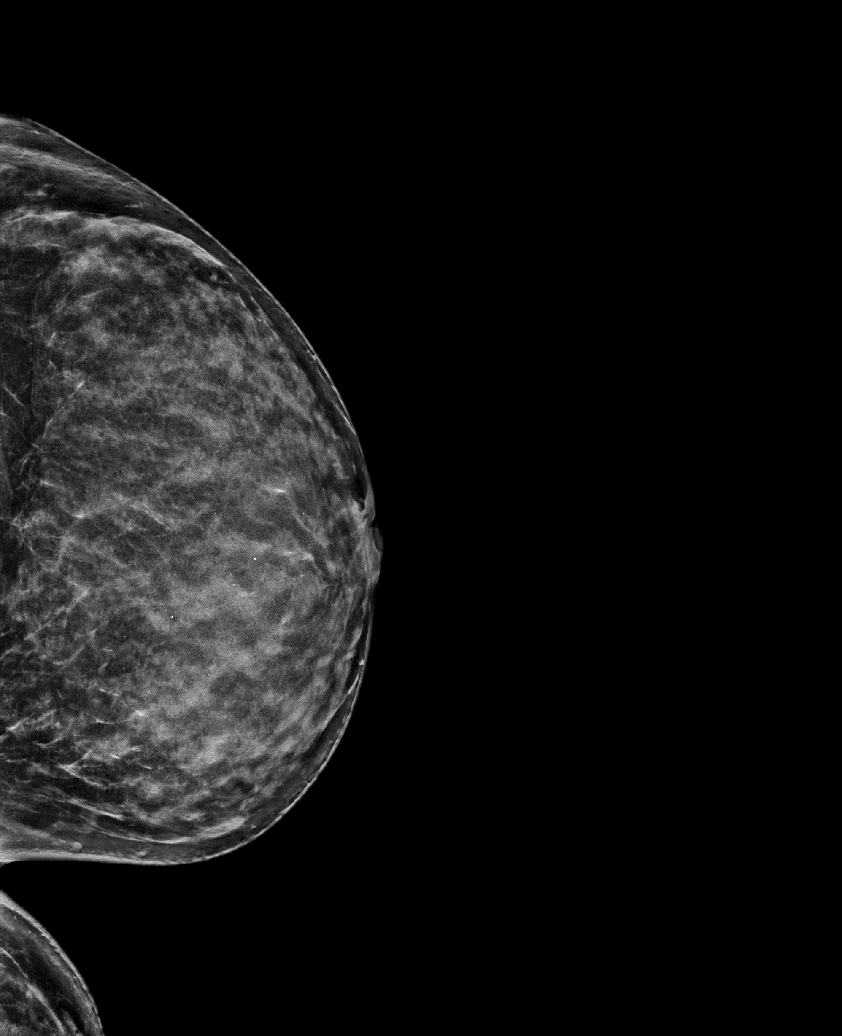

[L CC tomo · tomo slice 34/67.0]
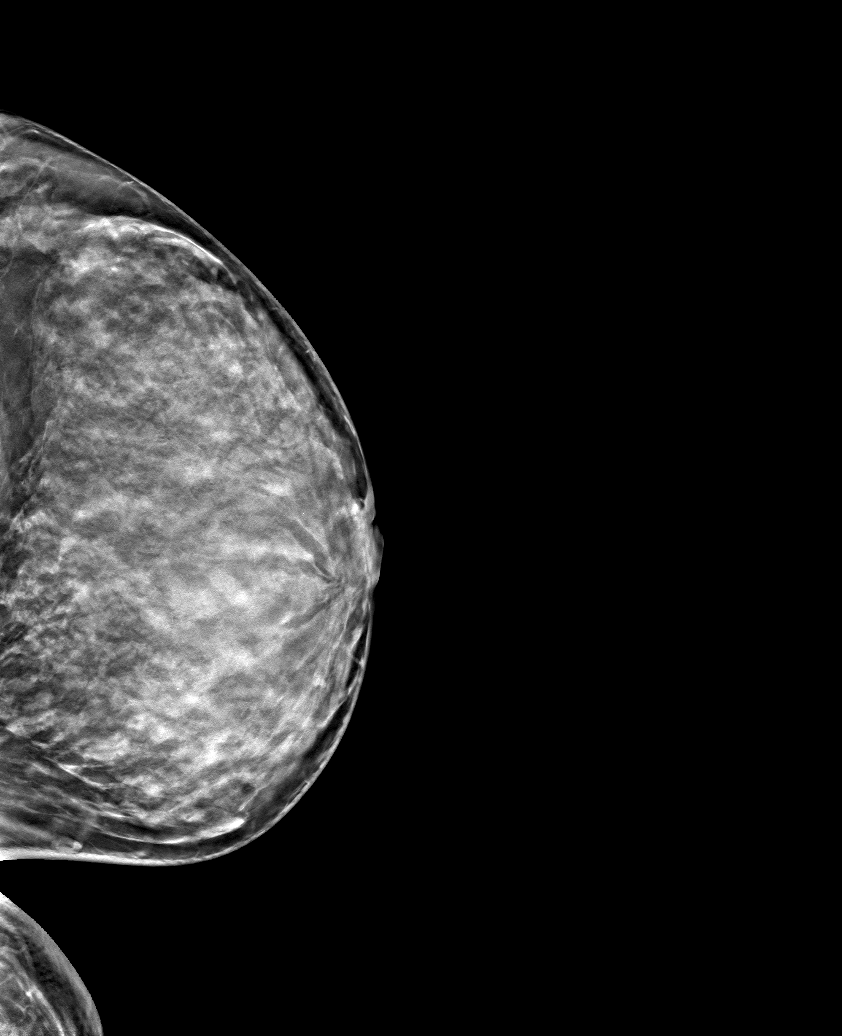

[6 of 30 positions shown; findings below may reference images not displayed]

ACR Breast Density Category d: The breast tissue is extremely dense,
which lowers the sensitivity of mammography
FINDINGS: There are no findings suspicious for malignancy. Stable RIGHT breast
postsurgical changes.
IMPRESSION: No mammographic evidence of malignancy. A result letter of this
screening mammogram will be mailed directly to the patient.

RECOMMENDATION:
Screening mammogram in one year. (Code:[LS])

The American Cancer Society recommends annual MRI and mammography in
patients with an estimated lifetime risk of developing breast cancer
greater than 20 - 25%, or who are known or suspected to be positive
for the breast cancer gene.

BI-RADS CATEGORY  2: Benign.

## 2020-11-09 ENCOUNTER — Other Ambulatory Visit: Payer: Self-pay | Admitting: Student

## 2020-11-18 ENCOUNTER — Other Ambulatory Visit: Payer: Self-pay | Admitting: Student

## 2020-11-18 DIAGNOSIS — Z803 Family history of malignant neoplasm of breast: Secondary | ICD-10-CM

## 2021-03-23 ENCOUNTER — Ambulatory Visit
Admission: RE | Admit: 2021-03-23 | Discharge: 2021-03-23 | Disposition: A | Discharge status: Home / Self Care | Payer: No Typology Code available for payment source | Source: Ambulatory Visit | Attending: Student | Admitting: Student

## 2021-03-23 ENCOUNTER — Other Ambulatory Visit: Payer: Self-pay

## 2021-03-23 DIAGNOSIS — Z803 Family history of malignant neoplasm of breast: Secondary | ICD-10-CM | POA: Diagnosis present

## 2021-03-23 DIAGNOSIS — R922 Inconclusive mammogram: Secondary | ICD-10-CM | POA: Diagnosis not present

## 2021-03-23 DIAGNOSIS — Z1239 Encounter for other screening for malignant neoplasm of breast: Secondary | ICD-10-CM | POA: Diagnosis not present

## 2021-03-23 IMAGING — MR MR BREAST BILAT WO/W CM
2 of 10 series · 5 of 48 positions shown · IV contrast (gadavist)
Comparison: Prior mammograms.

CLINICAL DATA: Family history of breast cancer. Patient has
extremely dense fibroglandular tissue.

EXAM:
BILATERAL BREAST MRI WITH AND WITHOUT CONTRAST
TECHNIQUE: Multiplanar, multisequence MR images of both breasts were obtained
prior to and following the intravenous administration of 6 ml of
Gadavist

[Series 2: T1 · axial · B · 1.5mm · 1.02mm/px · z∈[-40,+126]mm · 3 of 112 slices shown]
[im 1/112]
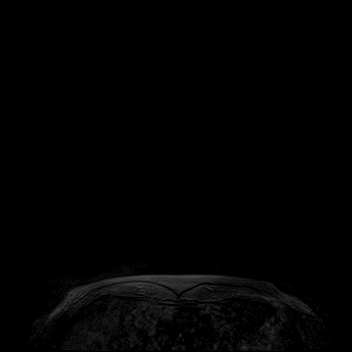
[im 56/112]
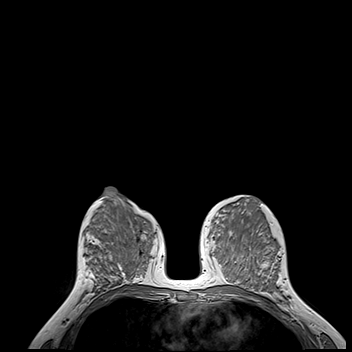
[im 112/112]
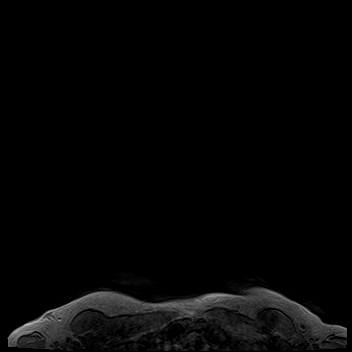

[Series 3: T2 · axial · B · 3.0mm · 1.02mm/px · z∈[-38,+124]mm · 2 of 46 slices shown]
[im 1/46]
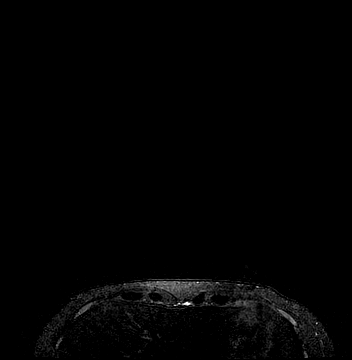
[im 46/46]
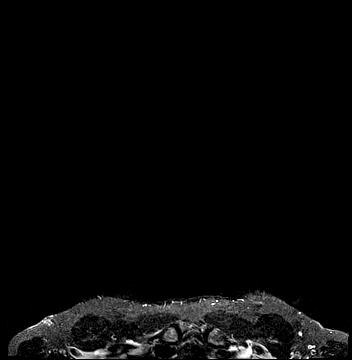

[5 of 48 positions shown; findings below may reference images not displayed]

Three-dimensional MR images were rendered by post-processing of the
original MR data on an independent workstation. The
three-dimensional MR images were interpreted, and findings are
reported in the following complete MRI report for this study. Three
dimensional images were evaluated at the independent interpreting
workstation using the DynaCAD thin client.
FINDINGS: Breast composition: d. Extreme fibroglandular tissue.

Background parenchymal enhancement: Marked

Right breast: There is a focal area of linear enhancement in the
central aspect of the right breast measuring 7 mm (series 6 image
53).

Left breast: No mass or abnormal enhancement.

Lymph nodes: No abnormal appearing lymph nodes.

Ancillary findings:  None.
IMPRESSION: Indeterminate focal area enhancement in the central right breast
(series 6, image 53).

RECOMMENDATION:
MR guided core biopsy of the enhancement in the right breast is
recommended

BI-RADS CATEGORY  4: Suspicious.

## 2021-03-23 MED ORDER — GADOBUTROL 1 MMOL/ML IV SOLN
6.0000 mL | Freq: Once | INTRAVENOUS | Status: AC | PRN
  Administered 2021-03-23: 6 mL via INTRAVENOUS

## 2021-04-13 ENCOUNTER — Other Ambulatory Visit: Payer: Self-pay | Admitting: Student

## 2021-04-13 DIAGNOSIS — R9389 Abnormal findings on diagnostic imaging of other specified body structures: Secondary | ICD-10-CM

## 2021-04-27 ENCOUNTER — Ambulatory Visit
Admission: RE | Admit: 2021-04-27 | Discharge: 2021-04-27 | Disposition: A | Discharge status: Home / Self Care | Payer: No Typology Code available for payment source | Source: Ambulatory Visit | Attending: Student | Admitting: Student

## 2021-04-27 ENCOUNTER — Ambulatory Visit: Admission: RE | Admit: 2021-04-27 | Payer: No Typology Code available for payment source | Source: Ambulatory Visit

## 2021-04-27 DIAGNOSIS — R9389 Abnormal findings on diagnostic imaging of other specified body structures: Secondary | ICD-10-CM

## 2021-05-25 ENCOUNTER — Ambulatory Visit
Admission: RE | Admit: 2021-05-25 | Discharge: 2021-05-25 | Disposition: A | Discharge status: Home / Self Care | Payer: No Typology Code available for payment source | Source: Ambulatory Visit | Attending: Student | Admitting: Student

## 2021-05-25 DIAGNOSIS — R9389 Abnormal findings on diagnostic imaging of other specified body structures: Secondary | ICD-10-CM

## 2021-05-25 IMAGING — MG MM BREAST LOCALIZATION CLIP
4 series · 4 of 12 positions shown · non-contrast
Comparison: With priors.

CLINICAL DATA: Status post MR guided core biopsy of the right
breast.

EXAM:
3D DIAGNOSTIC RIGHT MAMMOGRAM POST MRI BIOPSY

[R ML synth-2D]
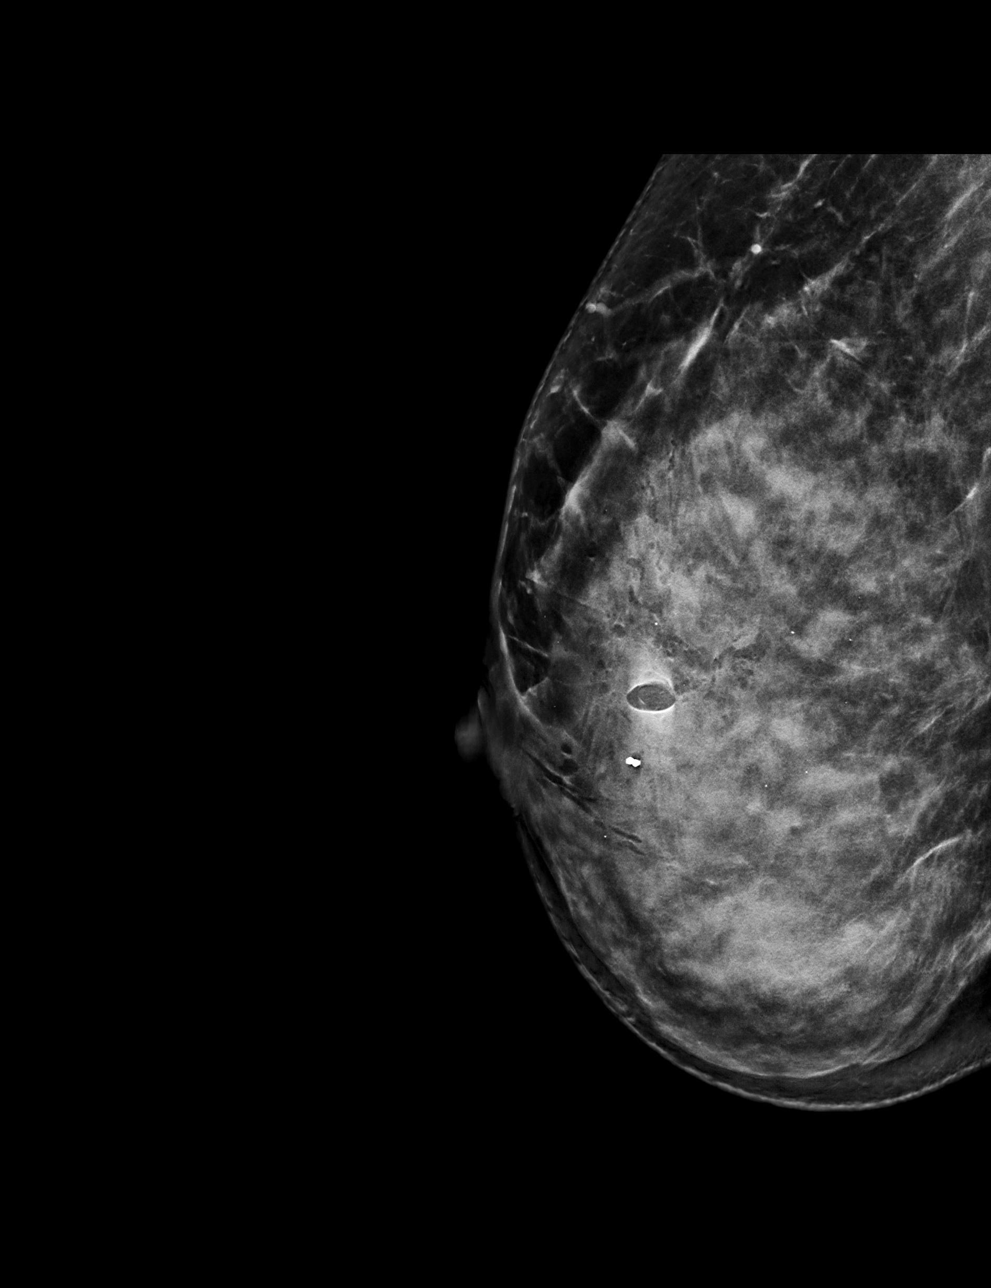

[R CC synth-2D]
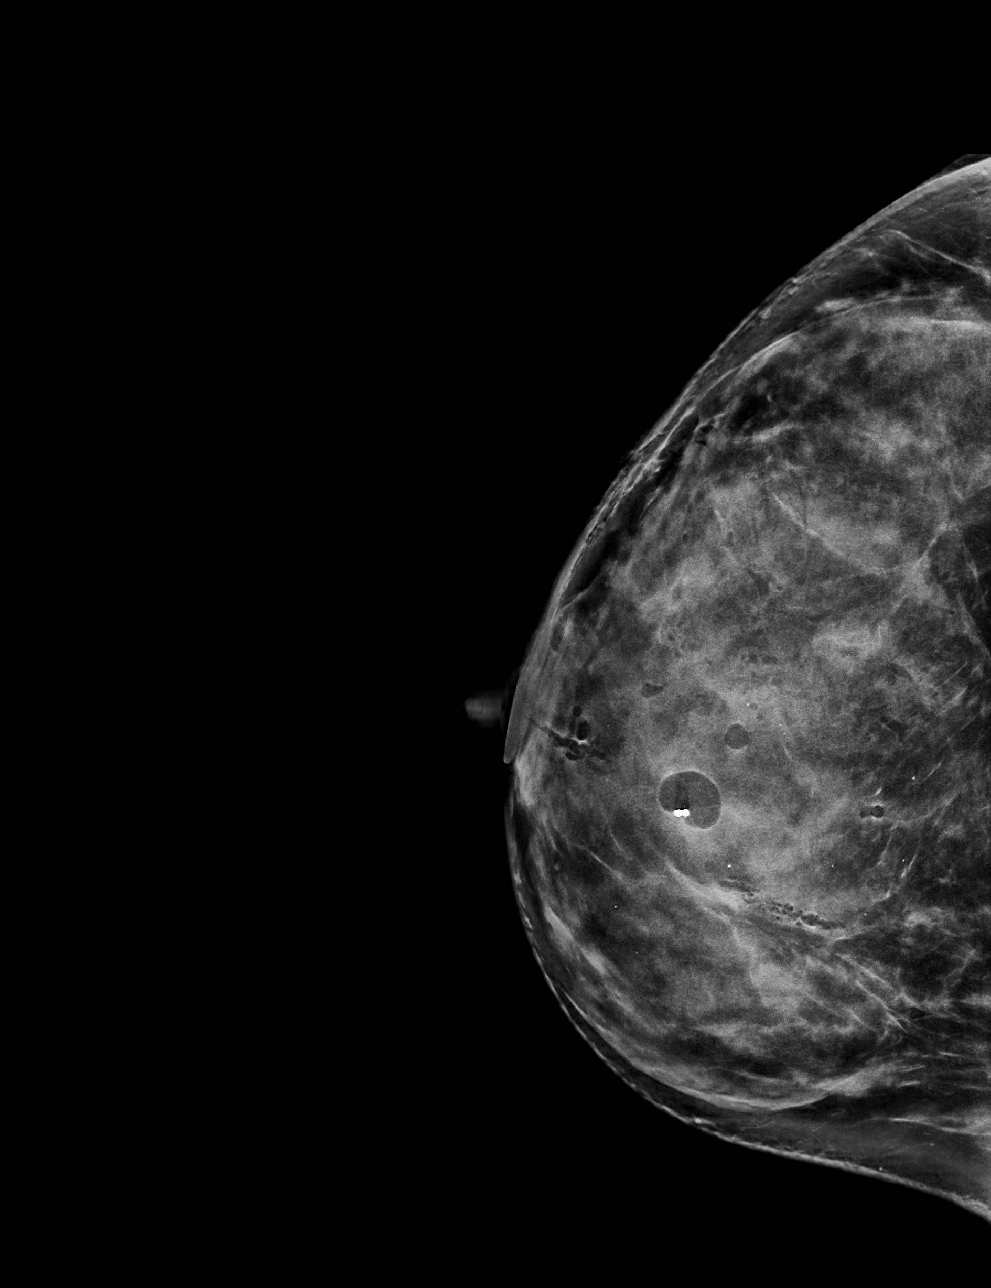

[R CC tomo · tomo slice 37/72.0]
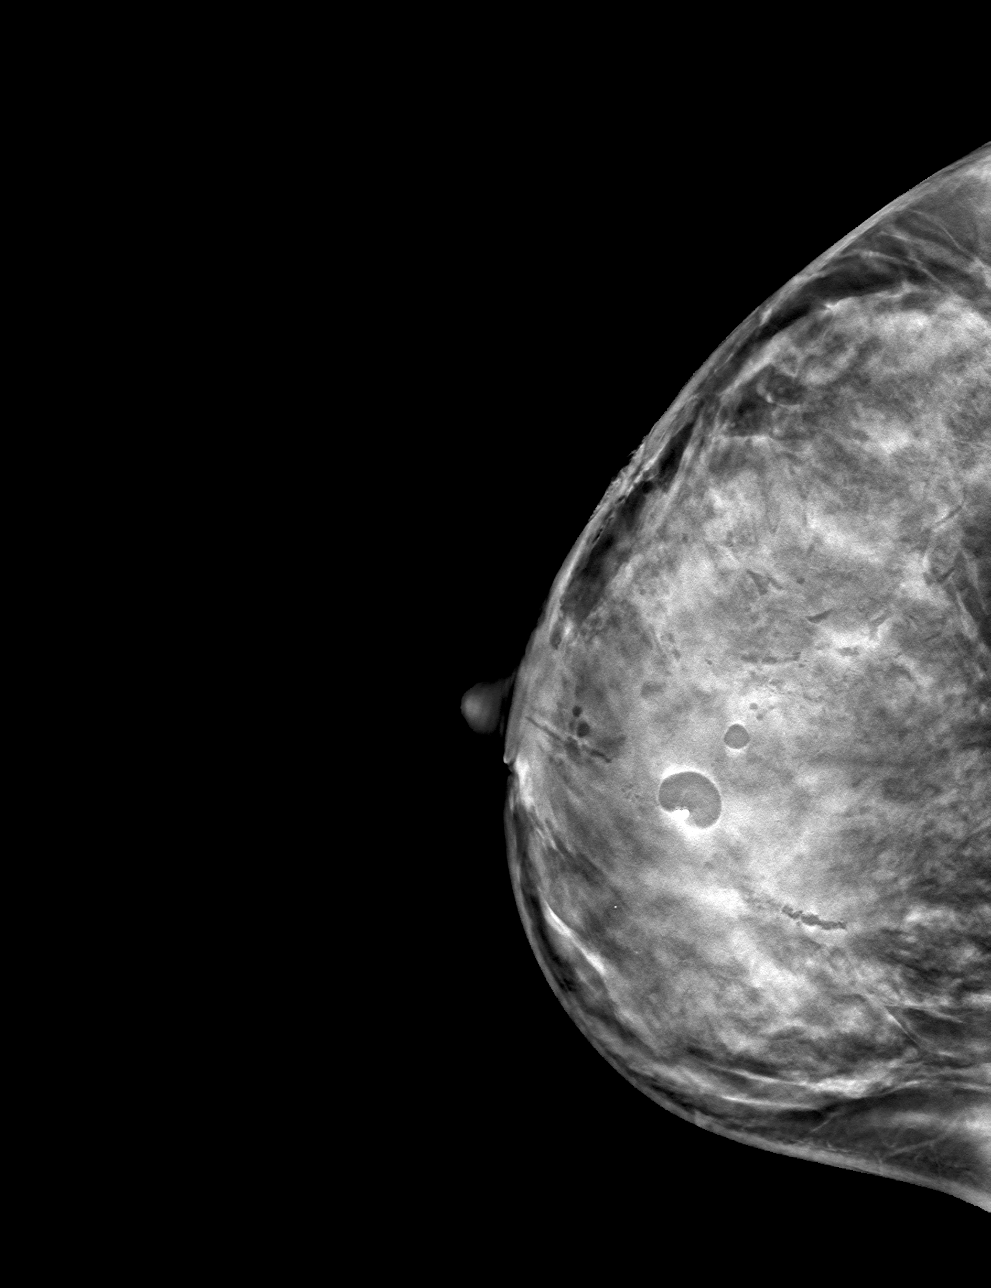

[R ML tomo · tomo slice 36/71.0]
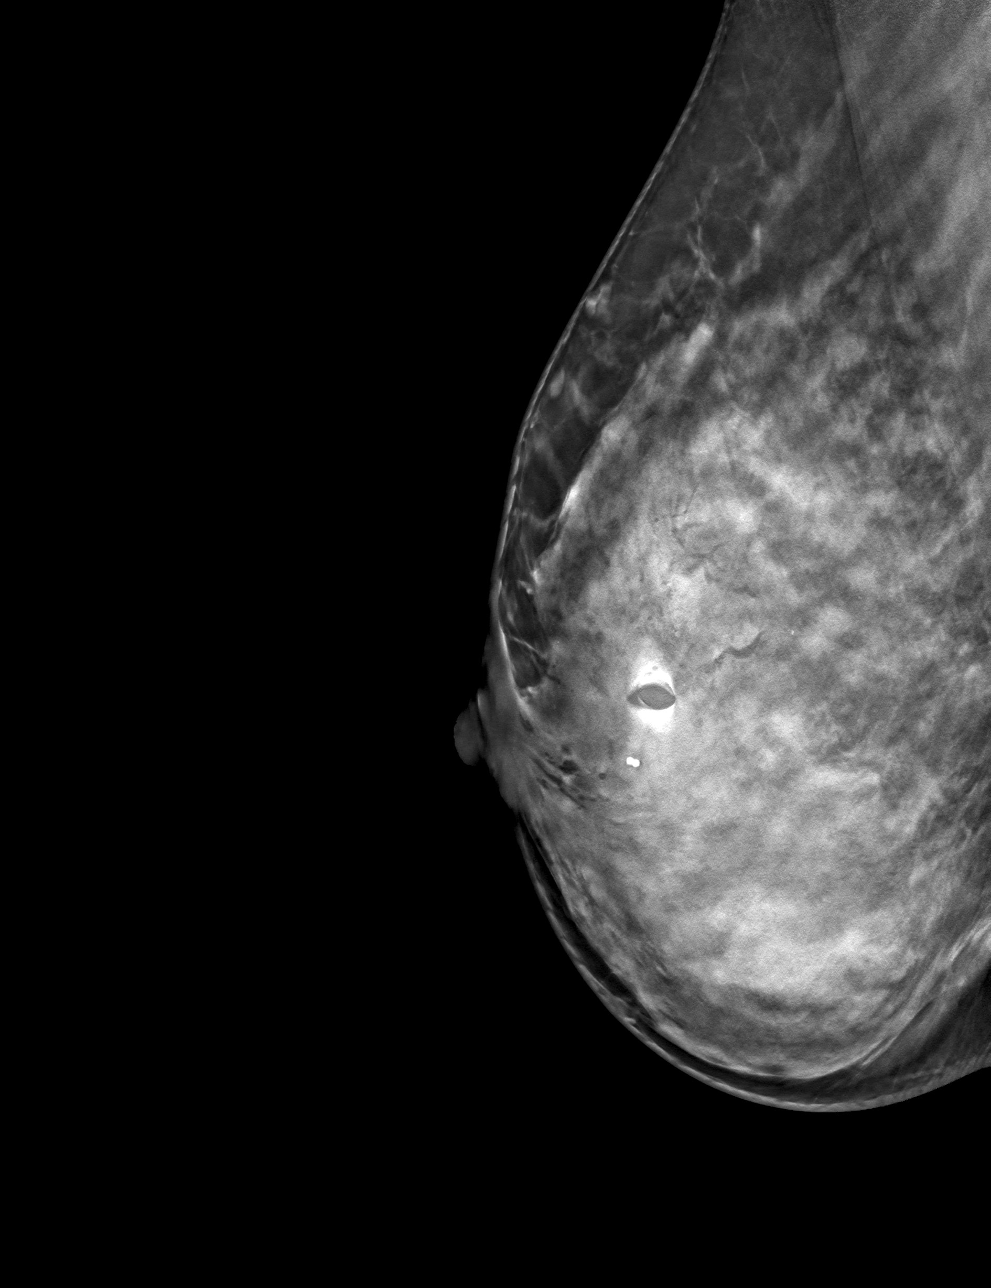

[4 of 12 positions shown; findings below may reference images not displayed]

FINDINGS: 3D Mammographic images were obtained following MRI guided biopsy of
the right breast. The biopsy marking clip is in expected location in
the central aspect of the right breast.
IMPRESSION: Appropriate positioning of the dumbbell shaped biopsy marking clip
at the site of biopsy in the central aspect of the right breast.

Final Assessment: Post Procedure Mammograms for Marker Placement

## 2021-05-25 IMAGING — MR MR BREAST BX W/ LOC DEV 1ST LEASION IMAGE BX SPEC MR GUIDE*R*
6 of 8 series · 32 of 48 positions shown · IV contrast (6 ml gadavist)
Comparison: None Available.
COMPARISON: None Available.

Addendum:
CLINICAL DATA: 40-year-old female with recent screening MRI showing
indeterminate enhancement in the central right breast.

EXAM:
MRI GUIDED CORE NEEDLE BIOPSY OF THE RIGHT BREAST
TECHNIQUE: Multiplanar, multisequence MR imaging of the right breast was
performed both before and after administration of intravenous
contrast.
CONTRAST:  6mL GADAVIST GADOBUTROL 1 MMOL/ML IV SOLN

[Series 2: fiducial unilateral · sagittal · 2.0mm · 1.33mm/px · 1 of 52 slices shown]
[im 1/52]
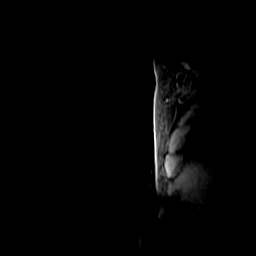

[Series 3: dynamic pre · axial · non-contrast · 1.3mm · 0.73mm/px · z∈[-92,+93]mm · 6 of 144 slices shown]
[im 1/144]
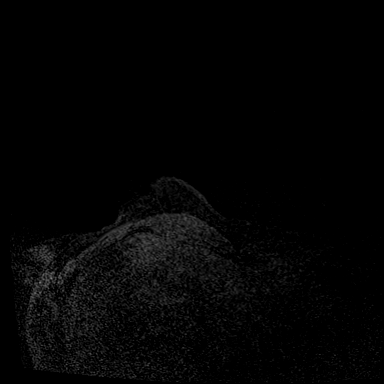
[im 29/144]
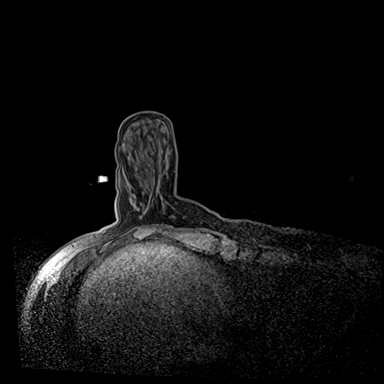
[im 58/144]
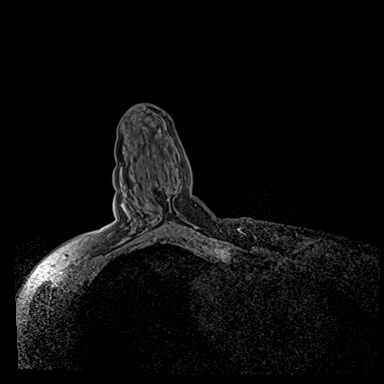
[im 86/144]
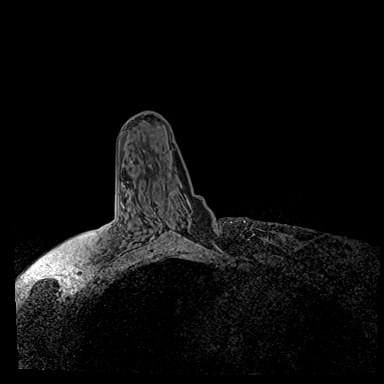
[im 115/144]
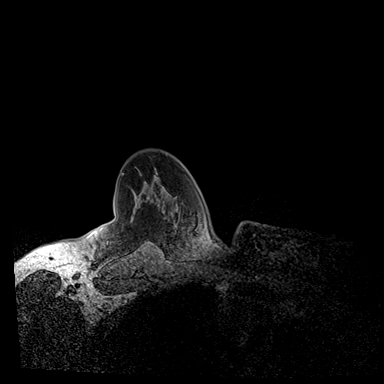
[im 144/144]
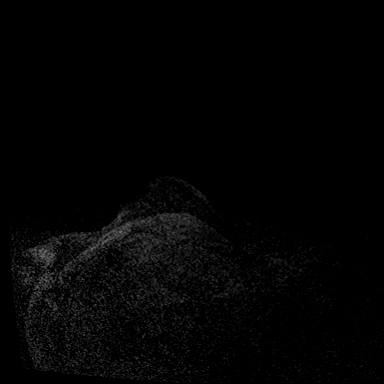

[Series 4: dynamic post 20 · axial · 1.3mm · 0.73mm/px · z∈[-92,+93]mm · 6 of 144 slices shown (1 of 2)]
[im 1/144]
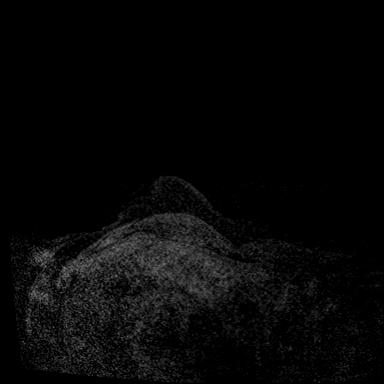
[im 29/144]
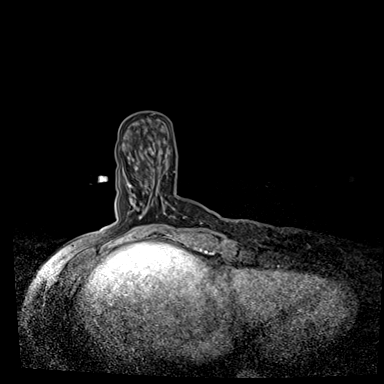
[im 58/144]
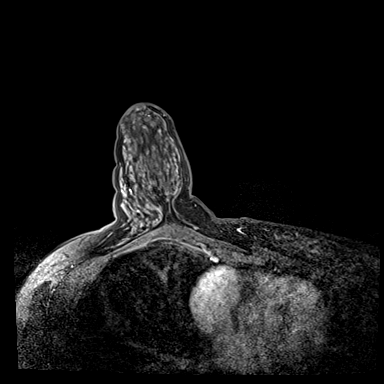
[im 86/144]
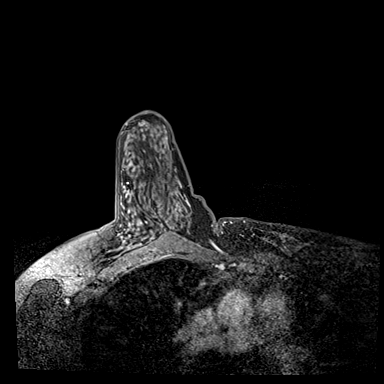
[im 115/144]
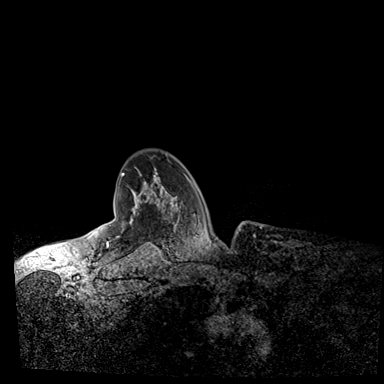
[im 144/144]
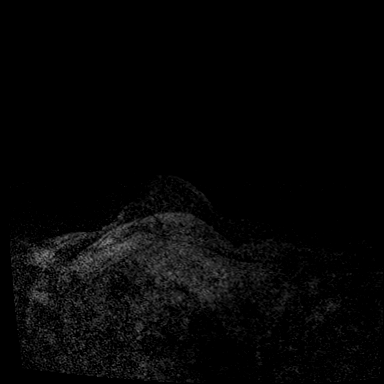

[Series 5: dynamic post 20 · axial · 1.3mm · 0.73mm/px · z∈[-92,+93]mm · 7 of 144 slices shown (2 of 2)]
[im 1/144]
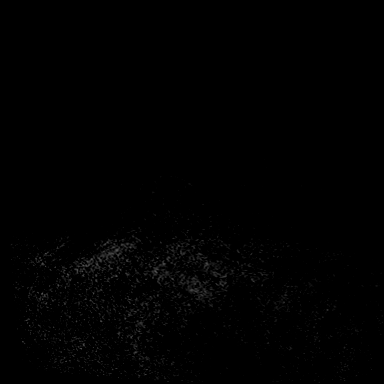
[im 24/144]
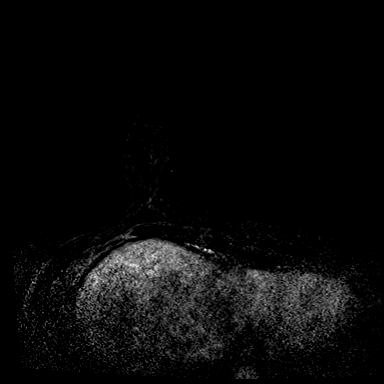
[im 48/144]
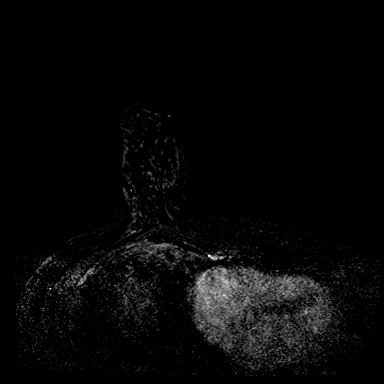
[im 72/144]
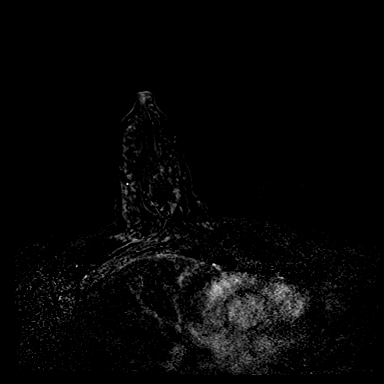
[im 96/144]
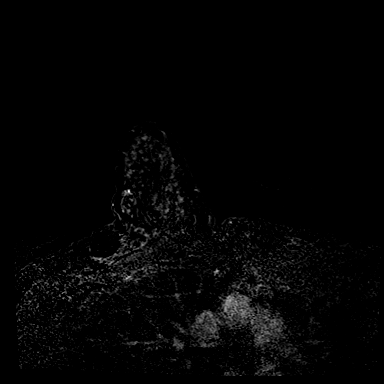
[im 120/144]
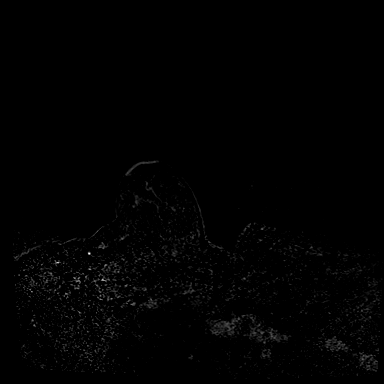
[im 144/144]
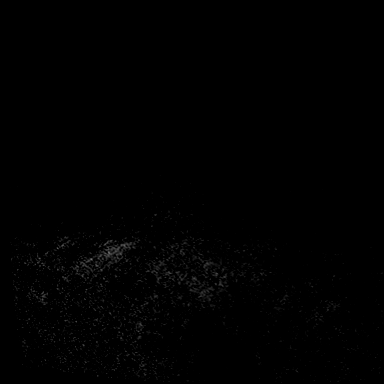

[Series 6: needle confirmation · axial · 1.3mm · 0.73mm/px · z∈[-92,+93]mm · 7 of 144 slices shown]
[im 1/144]
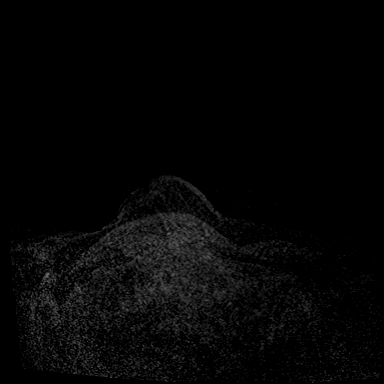
[im 24/144]
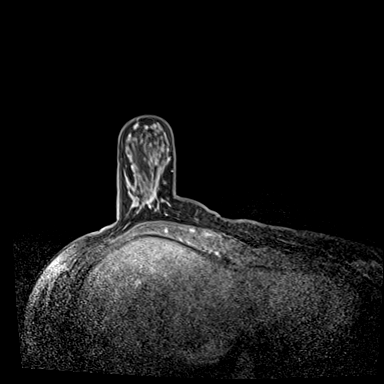
[im 48/144]
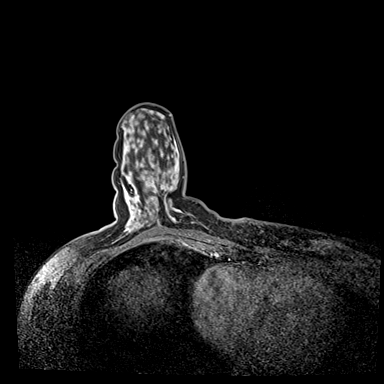
[im 72/144]
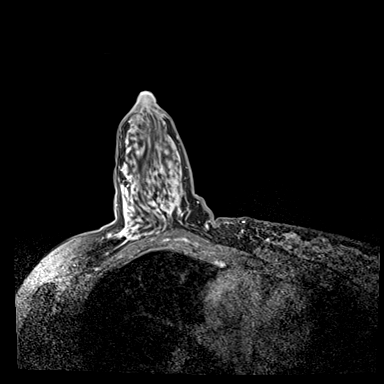
[im 96/144]
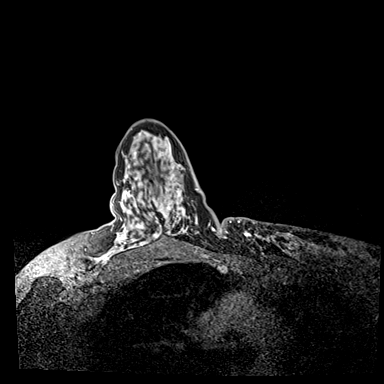
[im 120/144]
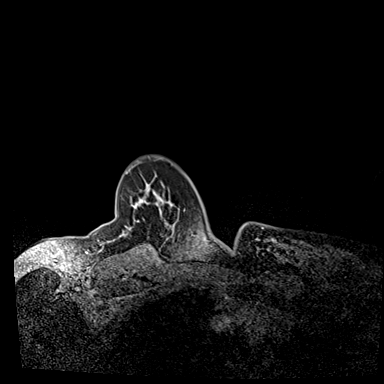
[im 144/144]
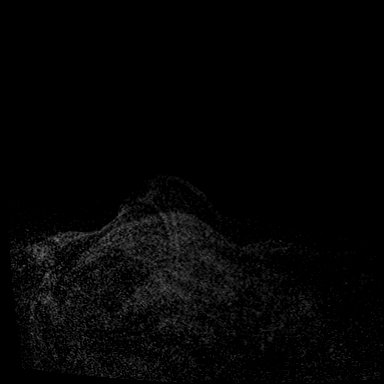

[Series 7: needle confirmation_sub · axial · 1.3mm · 0.73mm/px · z∈[-92,+31]mm · 5 of 144 slices shown]
[im 1/144]
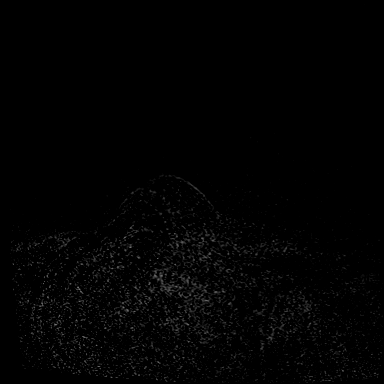
[im 24/144]
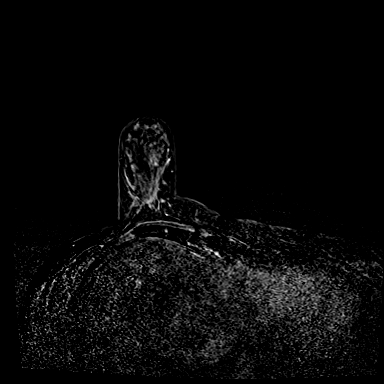
[im 48/144]
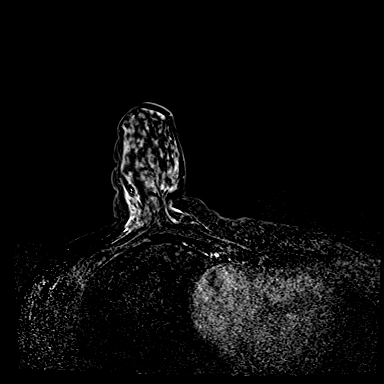
[im 72/144]
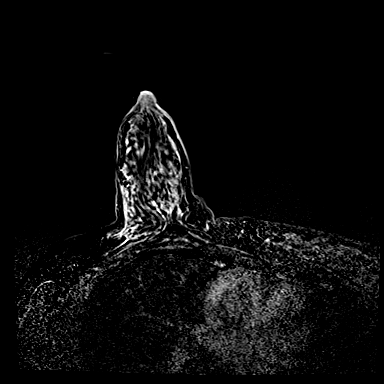
[im 96/144]
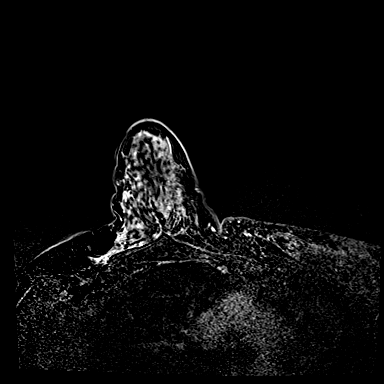

[32 of 48 positions shown; findings below may reference images not displayed]

FINDINGS: I met with the patient, and we discussed the procedure of MRI guided
biopsy, including risks, benefits, and alternatives. Specifically,
we discussed the risks of infection, bleeding, tissue injury, clip
migration, and inadequate sampling. Informed, written consent was
given. The usual time out protocol was performed immediately prior
to the procedure.

Using sterile technique, 1% Lidocaine, MRI guidance, and a 9 gauge
vacuum assisted device, biopsy was performed of enhancement in the
central aspect of the right breast using a lateral to medial
approach. At the conclusion of the procedure, a dumbbell tissue
marker clip was deployed into the biopsy cavity. Follow-up 2-view
mammogram was performed and dictated separately.
IMPRESSION: MRI guided biopsy of the right breast.  No apparent complications.

ADDENDUM:
Pathology revealed FIBROCYSTIC CHANGES WITH USUAL DUCTAL HYPERPLASIA
AND CALCIFICATIONS- NEGATIVE FOR MALIGNANCY of the RIGHT breast,
central. This was found to be concordant by Dr. ATIH.

Pathology results were discussed with the patient by telephone. The
patient reported doing well after the biopsy with tenderness at the
site. Post biopsy instructions and care were reviewed and questions
were answered. The patient was encouraged to call The [REDACTED]

Bilateral breast MRI recommended in 6 months per protocol.

Pathology results reported by ATIH RN on [DATE].

*** End of Addendum ***
FINDINGS: I met with the patient, and we discussed the procedure of MRI guided
biopsy, including risks, benefits, and alternatives. Specifically,
we discussed the risks of infection, bleeding, tissue injury, clip
migration, and inadequate sampling. Informed, written consent was
given. The usual time out protocol was performed immediately prior
to the procedure.

Using sterile technique, 1% Lidocaine, MRI guidance, and a 9 gauge
vacuum assisted device, biopsy was performed of enhancement in the
central aspect of the right breast using a lateral to medial
approach. At the conclusion of the procedure, a dumbbell tissue
marker clip was deployed into the biopsy cavity. Follow-up 2-view
mammogram was performed and dictated separately.
IMPRESSION: MRI guided biopsy of the right breast.  No apparent complications.

## 2021-05-25 MED ORDER — GADOBUTROL 1 MMOL/ML IV SOLN
6.0000 mL | Freq: Once | INTRAVENOUS | Status: AC | PRN
  Administered 2021-05-25: 6 mL via INTRAVENOUS

## 2021-11-14 DIAGNOSIS — R92323 Mammographic fibroglandular density, bilateral breasts: Secondary | ICD-10-CM | POA: Insufficient documentation

## 2021-11-15 ENCOUNTER — Other Ambulatory Visit: Payer: Self-pay | Admitting: Student

## 2021-11-15 DIAGNOSIS — Z1231 Encounter for screening mammogram for malignant neoplasm of breast: Secondary | ICD-10-CM

## 2021-11-15 DIAGNOSIS — Z803 Family history of malignant neoplasm of breast: Secondary | ICD-10-CM | POA: Insufficient documentation

## 2021-12-23 ENCOUNTER — Ambulatory Visit
Admission: RE | Admit: 2021-12-23 | Discharge: 2021-12-23 | Disposition: A | Discharge status: Home / Self Care | Payer: No Typology Code available for payment source | Source: Ambulatory Visit | Attending: Student | Admitting: Student

## 2021-12-23 DIAGNOSIS — Z1231 Encounter for screening mammogram for malignant neoplasm of breast: Secondary | ICD-10-CM | POA: Diagnosis not present

## 2021-12-27 ENCOUNTER — Other Ambulatory Visit: Payer: Self-pay | Admitting: Student

## 2021-12-27 DIAGNOSIS — R921 Mammographic calcification found on diagnostic imaging of breast: Secondary | ICD-10-CM

## 2021-12-27 DIAGNOSIS — R928 Other abnormal and inconclusive findings on diagnostic imaging of breast: Secondary | ICD-10-CM

## 2022-01-04 ENCOUNTER — Ambulatory Visit
Admission: RE | Admit: 2022-01-04 | Discharge: 2022-01-04 | Disposition: A | Discharge status: Home / Self Care | Payer: No Typology Code available for payment source | Source: Ambulatory Visit | Attending: Student | Admitting: Student

## 2022-01-04 ENCOUNTER — Other Ambulatory Visit: Payer: Self-pay | Admitting: Student

## 2022-01-04 DIAGNOSIS — R921 Mammographic calcification found on diagnostic imaging of breast: Secondary | ICD-10-CM

## 2022-01-04 DIAGNOSIS — R928 Other abnormal and inconclusive findings on diagnostic imaging of breast: Secondary | ICD-10-CM

## 2022-01-30 ENCOUNTER — Other Ambulatory Visit: Payer: Self-pay | Admitting: Student

## 2022-01-30 DIAGNOSIS — R221 Localized swelling, mass and lump, neck: Secondary | ICD-10-CM

## 2022-02-03 ENCOUNTER — Ambulatory Visit
Admission: RE | Admit: 2022-02-03 | Discharge: 2022-02-03 | Disposition: A | Discharge status: Home / Self Care | Payer: No Typology Code available for payment source | Source: Ambulatory Visit | Attending: Student | Admitting: Student

## 2022-02-03 DIAGNOSIS — R221 Localized swelling, mass and lump, neck: Secondary | ICD-10-CM | POA: Insufficient documentation

## 2022-04-19 ENCOUNTER — Other Ambulatory Visit: Payer: Self-pay | Admitting: Student

## 2022-04-19 DIAGNOSIS — R921 Mammographic calcification found on diagnostic imaging of breast: Secondary | ICD-10-CM

## 2022-06-06 ENCOUNTER — Ambulatory Visit
Admission: RE | Admit: 2022-06-06 | Discharge: 2022-06-06 | Disposition: A | Discharge status: Home / Self Care | Payer: No Typology Code available for payment source | Source: Ambulatory Visit | Attending: Student | Admitting: Student

## 2022-06-06 DIAGNOSIS — R921 Mammographic calcification found on diagnostic imaging of breast: Secondary | ICD-10-CM | POA: Diagnosis present

## 2022-06-07 ENCOUNTER — Other Ambulatory Visit: Payer: Self-pay | Admitting: Student

## 2022-06-07 DIAGNOSIS — R928 Other abnormal and inconclusive findings on diagnostic imaging of breast: Secondary | ICD-10-CM

## 2022-06-07 DIAGNOSIS — R921 Mammographic calcification found on diagnostic imaging of breast: Secondary | ICD-10-CM

## 2022-06-08 HISTORY — PX: BREAST BIOPSY: SHX20

## 2022-06-09 ENCOUNTER — Ambulatory Visit
Admission: RE | Admit: 2022-06-09 | Discharge: 2022-06-09 | Disposition: A | Discharge status: Home / Self Care | Payer: No Typology Code available for payment source | Source: Ambulatory Visit | Attending: Student | Admitting: Student

## 2022-06-09 ENCOUNTER — Ambulatory Visit
Admission: RE | Admit: 2022-06-09 | Discharge: 2022-06-09 | Disposition: A | Discharge status: Home / Self Care | Payer: No Typology Code available for payment source | Source: Ambulatory Visit | Attending: Student

## 2022-06-09 DIAGNOSIS — R928 Other abnormal and inconclusive findings on diagnostic imaging of breast: Secondary | ICD-10-CM

## 2022-06-09 DIAGNOSIS — R921 Mammographic calcification found on diagnostic imaging of breast: Secondary | ICD-10-CM | POA: Diagnosis present

## 2022-06-09 HISTORY — PX: BREAST BIOPSY: SHX20

## 2022-06-09 MED ORDER — LIDOCAINE HCL 1 % IJ SOLN
15.0000 mL | Freq: Once | INTRAMUSCULAR | Status: DC
  Filled 2022-06-09: qty 15

## 2022-06-09 MED ORDER — LIDOCAINE HCL 1 % IJ SOLN
15.0000 mL | Freq: Once | INTRAMUSCULAR | Status: AC
  Administered 2022-06-09: 15 mL
  Filled 2022-06-09: qty 15

## 2022-06-09 MED ORDER — LIDOCAINE-EPINEPHRINE 1 %-1:100000 IJ SOLN
10.0000 mL | Freq: Once | INTRAMUSCULAR | Status: AC
  Administered 2022-06-09: 10 mL
  Filled 2022-06-09: qty 10

## 2022-06-12 DIAGNOSIS — M545 Low back pain, unspecified: Secondary | ICD-10-CM | POA: Insufficient documentation

## 2022-06-12 LAB — SURGICAL PATHOLOGY

## 2022-09-08 ENCOUNTER — Other Ambulatory Visit: Payer: Self-pay | Admitting: Student

## 2022-09-08 DIAGNOSIS — Z803 Family history of malignant neoplasm of breast: Secondary | ICD-10-CM

## 2022-09-08 DIAGNOSIS — Z1239 Encounter for other screening for malignant neoplasm of breast: Secondary | ICD-10-CM

## 2022-09-15 ENCOUNTER — Ambulatory Visit
Admission: RE | Admit: 2022-09-15 | Discharge: 2022-09-15 | Disposition: A | Discharge status: Home / Self Care | Payer: No Typology Code available for payment source | Source: Ambulatory Visit | Attending: Student

## 2022-09-15 ENCOUNTER — Encounter: Payer: Self-pay | Admitting: Radiology

## 2022-09-15 DIAGNOSIS — Z803 Family history of malignant neoplasm of breast: Secondary | ICD-10-CM | POA: Diagnosis present

## 2022-09-15 DIAGNOSIS — Z1239 Encounter for other screening for malignant neoplasm of breast: Secondary | ICD-10-CM | POA: Insufficient documentation

## 2022-09-15 MED ORDER — GADOBUTROL 1 MMOL/ML IV SOLN
7.5000 mL | Freq: Once | INTRAVENOUS | Status: AC | PRN
  Administered 2022-09-15: 7.5 mL via INTRAVENOUS

## 2022-09-19 ENCOUNTER — Other Ambulatory Visit: Payer: Self-pay | Admitting: Student

## 2022-09-19 DIAGNOSIS — R928 Other abnormal and inconclusive findings on diagnostic imaging of breast: Secondary | ICD-10-CM

## 2022-09-26 ENCOUNTER — Ambulatory Visit
Admission: RE | Admit: 2022-09-26 | Discharge: 2022-09-26 | Disposition: A | Discharge status: Home / Self Care | Payer: No Typology Code available for payment source | Source: Ambulatory Visit | Attending: Student | Admitting: Student

## 2022-09-26 DIAGNOSIS — R928 Other abnormal and inconclusive findings on diagnostic imaging of breast: Secondary | ICD-10-CM | POA: Diagnosis present

## 2022-10-18 ENCOUNTER — Other Ambulatory Visit: Payer: Self-pay | Admitting: Student

## 2022-10-18 DIAGNOSIS — R928 Other abnormal and inconclusive findings on diagnostic imaging of breast: Secondary | ICD-10-CM

## 2022-10-25 ENCOUNTER — Ambulatory Visit
Admission: RE | Admit: 2022-10-25 | Discharge: 2022-10-25 | Disposition: A | Discharge status: Home / Self Care | Payer: 59 | Source: Ambulatory Visit | Attending: Student | Admitting: Student

## 2022-10-25 ENCOUNTER — Ambulatory Visit
Admission: RE | Admit: 2022-10-25 | Discharge: 2022-10-25 | Disposition: A | Discharge status: Home / Self Care | Payer: 59 | Source: Ambulatory Visit | Attending: Student

## 2022-10-25 DIAGNOSIS — R928 Other abnormal and inconclusive findings on diagnostic imaging of breast: Secondary | ICD-10-CM

## 2022-10-25 MED ORDER — GADOPICLENOL 0.5 MMOL/ML IV SOLN
7.0000 mL | Freq: Once | INTRAVENOUS | Status: AC | PRN
  Administered 2022-10-25: 7 mL via INTRAVENOUS

## 2022-10-26 LAB — SURGICAL PATHOLOGY

## 2023-01-03 ENCOUNTER — Other Ambulatory Visit: Payer: Self-pay | Admitting: Certified Nurse Midwife

## 2023-01-03 DIAGNOSIS — Z1231 Encounter for screening mammogram for malignant neoplasm of breast: Secondary | ICD-10-CM

## 2023-02-01 ENCOUNTER — Ambulatory Visit
Admission: RE | Admit: 2023-02-01 | Discharge: 2023-02-01 | Disposition: A | Discharge status: Home / Self Care | Payer: 59 | Source: Ambulatory Visit | Attending: Certified Nurse Midwife | Admitting: Certified Nurse Midwife

## 2023-02-01 DIAGNOSIS — Z1231 Encounter for screening mammogram for malignant neoplasm of breast: Secondary | ICD-10-CM | POA: Diagnosis present

## 2023-07-31 ENCOUNTER — Other Ambulatory Visit: Payer: Self-pay | Admitting: Student

## 2023-07-31 DIAGNOSIS — R92323 Mammographic fibroglandular density, bilateral breasts: Secondary | ICD-10-CM

## 2023-07-31 DIAGNOSIS — Z803 Family history of malignant neoplasm of breast: Secondary | ICD-10-CM

## 2023-07-31 DIAGNOSIS — Z9189 Other specified personal risk factors, not elsewhere classified: Secondary | ICD-10-CM

## 2023-09-14 ENCOUNTER — Ambulatory Visit
Admission: RE | Admit: 2023-09-14 | Discharge: 2023-09-14 | Disposition: A | Discharge status: Home / Self Care | Source: Ambulatory Visit | Attending: Student | Admitting: Student

## 2023-09-14 ENCOUNTER — Encounter: Payer: Self-pay | Admitting: Radiology

## 2023-09-14 DIAGNOSIS — R92323 Mammographic fibroglandular density, bilateral breasts: Secondary | ICD-10-CM | POA: Diagnosis present

## 2023-09-14 DIAGNOSIS — Z9189 Other specified personal risk factors, not elsewhere classified: Secondary | ICD-10-CM | POA: Diagnosis present

## 2023-09-14 DIAGNOSIS — Z803 Family history of malignant neoplasm of breast: Secondary | ICD-10-CM | POA: Insufficient documentation

## 2023-09-14 MED ORDER — GADOBUTROL 1 MMOL/ML IV SOLN
6.0000 mL | Freq: Once | INTRAVENOUS | Status: AC | PRN
  Administered 2023-09-14: 6 mL via INTRAVENOUS
